# Patient Record
Sex: Female | Born: 1955 | Race: White | Hispanic: No | Smoking: Never smoker
Health system: Southern US, Community
[De-identification: ages and names within clinical notes are randomized; demographics above are authoritative.]

## PROBLEM LIST (undated history)

## (undated) DIAGNOSIS — Z8049 Family history of malignant neoplasm of other genital organs: Secondary | ICD-10-CM

## (undated) DIAGNOSIS — Z8042 Family history of malignant neoplasm of prostate: Secondary | ICD-10-CM

## (undated) DIAGNOSIS — F419 Anxiety disorder, unspecified: Secondary | ICD-10-CM

## (undated) DIAGNOSIS — Z800 Family history of malignant neoplasm of digestive organs: Secondary | ICD-10-CM

## (undated) DIAGNOSIS — R87619 Unspecified abnormal cytological findings in specimens from cervix uteri: Secondary | ICD-10-CM

## (undated) DIAGNOSIS — Z9189 Other specified personal risk factors, not elsewhere classified: Secondary | ICD-10-CM

## (undated) DIAGNOSIS — D219 Benign neoplasm of connective and other soft tissue, unspecified: Secondary | ICD-10-CM

## (undated) DIAGNOSIS — R011 Cardiac murmur, unspecified: Secondary | ICD-10-CM

## (undated) DIAGNOSIS — M810 Age-related osteoporosis without current pathological fracture: Secondary | ICD-10-CM

## (undated) DIAGNOSIS — IMO0002 Reserved for concepts with insufficient information to code with codable children: Secondary | ICD-10-CM

## (undated) DIAGNOSIS — M8580 Other specified disorders of bone density and structure, unspecified site: Secondary | ICD-10-CM

## (undated) DIAGNOSIS — F329 Major depressive disorder, single episode, unspecified: Secondary | ICD-10-CM

## (undated) DIAGNOSIS — F32A Depression, unspecified: Secondary | ICD-10-CM

## (undated) DIAGNOSIS — I493 Ventricular premature depolarization: Secondary | ICD-10-CM

## (undated) HISTORY — PX: AUGMENTATION MAMMAPLASTY: SUR837

## (undated) HISTORY — DX: Age-related osteoporosis without current pathological fracture: M81.0

## (undated) HISTORY — DX: Reserved for concepts with insufficient information to code with codable children: IMO0002

## (undated) HISTORY — DX: Unspecified abnormal cytological findings in specimens from cervix uteri: R87.619

## (undated) HISTORY — DX: Anxiety disorder, unspecified: F41.9

## (undated) HISTORY — DX: Benign neoplasm of connective and other soft tissue, unspecified: D21.9

## (undated) HISTORY — DX: Ventricular premature depolarization: I49.3

## (undated) HISTORY — DX: Family history of malignant neoplasm of prostate: Z80.42

## (undated) HISTORY — DX: Major depressive disorder, single episode, unspecified: F32.9

## (undated) HISTORY — DX: Family history of malignant neoplasm of other genital organs: Z80.49

## (undated) HISTORY — PX: KNEE SURGERY: SHX244

## (undated) HISTORY — DX: Depression, unspecified: F32.A

## (undated) HISTORY — DX: Cardiac murmur, unspecified: R01.1

## (undated) HISTORY — DX: Family history of malignant neoplasm of digestive organs: Z80.0

## (undated) HISTORY — PX: TOE SURGERY: SHX1073

## (undated) HISTORY — PX: BREAST EXCISIONAL BIOPSY: SUR124

## (undated) HISTORY — DX: Other specified personal risk factors, not elsewhere classified: Z91.89

## (undated) HISTORY — DX: Other specified disorders of bone density and structure, unspecified site: M85.80

---

## 1998-08-03 HISTORY — PX: ABDOMINAL HYSTERECTOMY: SHX81

## 1998-09-01 ENCOUNTER — Inpatient Hospital Stay (HOSPITAL_COMMUNITY): Admission: RE | Admit: 1998-09-01 | Discharge: 1998-09-03 | Payer: Self-pay | Admitting: Gynecology

## 1999-01-28 ENCOUNTER — Ambulatory Visit (HOSPITAL_COMMUNITY): Admission: RE | Admit: 1999-01-28 | Discharge: 1999-01-28 | Payer: Self-pay | Admitting: Gastroenterology

## 1999-06-17 ENCOUNTER — Encounter: Payer: Self-pay | Admitting: Gynecology

## 1999-06-17 ENCOUNTER — Encounter: Admission: RE | Admit: 1999-06-17 | Discharge: 1999-06-17 | Payer: Self-pay | Admitting: Gynecology

## 1999-11-03 ENCOUNTER — Other Ambulatory Visit: Admission: RE | Admit: 1999-11-03 | Discharge: 1999-11-03 | Payer: Self-pay | Admitting: Gynecology

## 1999-11-22 ENCOUNTER — Encounter: Payer: Self-pay | Admitting: Gynecology

## 1999-11-22 ENCOUNTER — Encounter: Admission: RE | Admit: 1999-11-22 | Discharge: 1999-11-22 | Payer: Self-pay | Admitting: Gynecology

## 2000-08-24 ENCOUNTER — Encounter: Admission: RE | Admit: 2000-08-24 | Discharge: 2000-08-24 | Payer: Self-pay | Admitting: Gynecology

## 2000-08-24 ENCOUNTER — Encounter: Payer: Self-pay | Admitting: Gynecology

## 2001-09-03 ENCOUNTER — Encounter: Payer: Self-pay | Admitting: Gynecology

## 2001-09-03 ENCOUNTER — Encounter: Admission: RE | Admit: 2001-09-03 | Discharge: 2001-09-03 | Payer: Self-pay | Admitting: Gynecology

## 2001-10-01 ENCOUNTER — Other Ambulatory Visit: Admission: RE | Admit: 2001-10-01 | Discharge: 2001-10-01 | Payer: Self-pay | Admitting: Gynecology

## 2001-11-22 ENCOUNTER — Encounter: Admission: RE | Admit: 2001-11-22 | Discharge: 2001-11-22 | Payer: Self-pay | Admitting: Gynecology

## 2001-11-22 ENCOUNTER — Encounter: Payer: Self-pay | Admitting: Gynecology

## 2002-01-19 ENCOUNTER — Ambulatory Visit (HOSPITAL_COMMUNITY): Admission: RE | Admit: 2002-01-19 | Discharge: 2002-01-19 | Payer: Self-pay | Admitting: Family Medicine

## 2002-01-19 ENCOUNTER — Encounter: Payer: Self-pay | Admitting: Family Medicine

## 2002-10-01 ENCOUNTER — Encounter: Payer: Self-pay | Admitting: Gynecology

## 2002-10-01 ENCOUNTER — Encounter: Admission: RE | Admit: 2002-10-01 | Discharge: 2002-10-01 | Payer: Self-pay | Admitting: Gynecology

## 2002-10-09 ENCOUNTER — Other Ambulatory Visit: Admission: RE | Admit: 2002-10-09 | Discharge: 2002-10-09 | Payer: Self-pay | Admitting: Gynecology

## 2003-12-17 ENCOUNTER — Encounter: Admission: RE | Admit: 2003-12-17 | Discharge: 2003-12-17 | Payer: Self-pay | Admitting: Gynecology

## 2004-11-08 ENCOUNTER — Other Ambulatory Visit: Admission: RE | Admit: 2004-11-08 | Discharge: 2004-11-08 | Payer: Self-pay | Admitting: Gynecology

## 2005-04-12 ENCOUNTER — Encounter: Admission: RE | Admit: 2005-04-12 | Discharge: 2005-04-12 | Payer: Self-pay | Admitting: Gynecology

## 2005-11-28 ENCOUNTER — Other Ambulatory Visit: Admission: RE | Admit: 2005-11-28 | Discharge: 2005-11-28 | Payer: Self-pay | Admitting: Gynecology

## 2006-07-18 ENCOUNTER — Encounter: Admission: RE | Admit: 2006-07-18 | Discharge: 2006-07-18 | Payer: Self-pay | Admitting: Gynecology

## 2006-12-25 ENCOUNTER — Other Ambulatory Visit: Admission: RE | Admit: 2006-12-25 | Discharge: 2006-12-25 | Payer: Self-pay | Admitting: Gynecology

## 2007-06-18 ENCOUNTER — Other Ambulatory Visit: Admission: RE | Admit: 2007-06-18 | Discharge: 2007-06-18 | Payer: Self-pay | Admitting: Gynecology

## 2007-11-13 ENCOUNTER — Encounter: Admission: RE | Admit: 2007-11-13 | Discharge: 2007-11-13 | Payer: Self-pay | Admitting: Gynecology

## 2009-06-28 ENCOUNTER — Encounter: Admission: RE | Admit: 2009-06-28 | Discharge: 2009-06-28 | Payer: Self-pay | Admitting: Gynecology

## 2010-07-24 ENCOUNTER — Encounter: Payer: Self-pay | Admitting: Gynecology

## 2010-08-24 ENCOUNTER — Other Ambulatory Visit: Payer: Self-pay | Admitting: Gynecology

## 2010-08-24 DIAGNOSIS — Z1231 Encounter for screening mammogram for malignant neoplasm of breast: Secondary | ICD-10-CM

## 2010-09-09 ENCOUNTER — Ambulatory Visit: Payer: Self-pay

## 2010-09-16 ENCOUNTER — Ambulatory Visit: Payer: Self-pay

## 2011-06-02 ENCOUNTER — Ambulatory Visit: Payer: Self-pay

## 2011-06-12 ENCOUNTER — Ambulatory Visit: Payer: Self-pay

## 2011-06-19 ENCOUNTER — Ambulatory Visit
Admission: RE | Admit: 2011-06-19 | Discharge: 2011-06-19 | Disposition: A | Discharge status: Home / Self Care | Payer: PRIVATE HEALTH INSURANCE | Source: Ambulatory Visit | Attending: Gynecology | Admitting: Gynecology

## 2011-06-19 DIAGNOSIS — Z1231 Encounter for screening mammogram for malignant neoplasm of breast: Secondary | ICD-10-CM

## 2012-12-11 ENCOUNTER — Encounter: Payer: Self-pay | Admitting: Obstetrics and Gynecology

## 2012-12-31 ENCOUNTER — Encounter: Payer: Self-pay | Admitting: Obstetrics and Gynecology

## 2013-01-13 ENCOUNTER — Ambulatory Visit (INDEPENDENT_AMBULATORY_CARE_PROVIDER_SITE_OTHER): Payer: PRIVATE HEALTH INSURANCE | Admitting: Obstetrics and Gynecology

## 2013-01-13 ENCOUNTER — Encounter: Payer: Self-pay | Admitting: Obstetrics and Gynecology

## 2013-01-13 VITALS — BP 100/58 | HR 80 | Ht 64.5 in | Wt 112.5 lb

## 2013-01-13 DIAGNOSIS — Z1231 Encounter for screening mammogram for malignant neoplasm of breast: Secondary | ICD-10-CM

## 2013-01-13 DIAGNOSIS — Z01419 Encounter for gynecological examination (general) (routine) without abnormal findings: Secondary | ICD-10-CM

## 2013-01-13 DIAGNOSIS — M899 Disorder of bone, unspecified: Secondary | ICD-10-CM

## 2013-01-13 DIAGNOSIS — Z0000 Encounter for general adult medical examination without abnormal findings: Secondary | ICD-10-CM

## 2013-01-13 DIAGNOSIS — M8580 Other specified disorders of bone density and structure, unspecified site: Secondary | ICD-10-CM | POA: Insufficient documentation

## 2013-01-13 LAB — POCT URINALYSIS DIPSTICK
Protein, UA: NEGATIVE
Urobilinogen, UA: NEGATIVE
pH, UA: 5

## 2013-01-13 MED ORDER — ESTRADIOL 0.1 MG/GM VA CREA: TOPICAL_CREAM | VAGINAL | Status: DC

## 2013-01-13 NOTE — Progress Notes (Signed)
Patient ID: Jenna Obrien, female   DOB: 07-15-1955, 56 y.o.   MRN: [id number] 57 y.o.   Married    Caucasian   female   G0P0   here for annual exam.   Due for a repeat pap smear. Painful intercourse.  Ran out of Estrace vaginal cream.   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  Patient cannot tell if she is getting benefit from this.  Considering stopping. Some night sweats.  Not disruptive to sleep.  No LMP recorded. Patient has had a hysterectomy.          Sexually active: yes  The current method of family planning is status post hysterectomy for uterine fibroids.  Still has ovaries. Exercising: no  Last mammogram:  06/2011 w[facility] Last pap smear: 12/2010 incomplete:?atrophic cells:advised to repeat 12/2010 but never did. History of abnormal pap: no Smoking: no Alcohol: 2 - 3 glasses of wine per night Last colonoscopy: 8-9 years ago with Dr. Medoff:[facility] Last Bone Density:  1-2 years ago through Dr. Shelbie Hutching Last tetanus shot: unsure.  Patient will check with her PCP.  Last cholesterol check: 05/2012:[facility] - Dr Clelia Croft.  Hgb:                Urine: Neg   Family History  Problem Relation Age of Onset  . Colon cancer Paternal Aunt   . Colon cancer Paternal Uncle   . Colon cancer Paternal Grandfather   . Diabetes Mother   . Hypertension Mother   . Thyroid disease Mother     There are no active problems to display for this patient.   Past Medical History  Diagnosis Date  . Fibroid   . Anxiety   . Depression   . Dyspareunia   . Heart murmur   . Osteopenia     Past Surgical History  Procedure Laterality Date  . Abdominal hysterectomy  2000    TAH--Due to fibroids--Dr. Nicholas Lose  . Augmentation mammaplasty      Allergies: Review of patient's allergies indicates no known allergies.  Current Outpatient Prescriptions  Medication Sig Dispense Refill  . estradiol (VIVELLE-DOT) 0.05 MG/24HR Place 1 patch onto the skin. Twice weekly      . rosuvastatin  (CRESTOR) 10 MG tablet Take 10 mg by mouth daily. Takes 1/2 tablet daily      . venlafaxine XR (EFFEXOR XR) 150 MG 24 hr capsule Take 150 mg by mouth. Takes 2 tablets daily      . Vitamin D, Ergocalciferol, (DRISDOL) 50000 UNITS CAPS Take 50,000 Units by mouth every 7 (seven) days.       No current facility-administered medications for this visit.    ROS: Pertinent items are noted in HPI.  Social Hx:    Exam:    BP 100/58  Pulse 80  Ht 5' 4.5" (1.638 m)  Wt 112 lb 8 oz (51.03 kg)  BMI 19.02 kg/m2   Wt Readings from Last 3 Encounters:  01/13/13 112 lb 8 oz (51.03 kg)     Ht Readings from Last 3 Encounters:  01/13/13 5' 4.5" (1.638 m)    General appearance: alert, cooperative and appears stated age Head: Normocephalic, without obvious abnormality, atraumatic Neck: no adenopathy, supple, symmetrical, trachea midline and thyroid not enlarged, symmetric, no tenderness/mass/nodules Lungs: clear to auscultation bilaterally Breasts: Inspection negative, No nipple retraction or dimpling, No nipple discharge or bleeding, No axillary or supraclavicular adenopathy, Normal to palpation without dominant masses Heart: regular rate and rhythm Abdomen: soft, non-tender;  bowel sounds normal; no masses,  no organomegaly Extremities: extremities normal, atraumatic, no cyanosis or edema Skin: Skin color, texture, turgor normal. No rashes or lesions Lymph nodes: Cervical, supraclavicular, and axillary nodes normal. No abnormal inguinal nodes palpated Neurologic: Grossly normal   Pelvic: External genitalia:  no lesions              Urethra:  normal appearing urethra with no masses, tenderness or lesions              Bartholins and Skenes: normal                 Vagina: normal appearing vagina with normal color and discharge, no lesions              Cervix:  absent              Pap taken: yes and high risk HPV testing.        Bimanual Exam:  Uterus:  uterus is normal size, shape, consistency and  nontender                                      Adnexa: normal adnexa in size, nontender and no masses                                      Rectovaginal: Confirms                                      Anus:  normal sphincter tone, no lesions  A: normal menopausal exam History of abnormal pap smear - atrophy? Osteopenia. History of DES exposure History of abnormal pap Family history of colon cancer   P: mammogram pap smear and high risk HPV Rx for Estrace vaginal cream.  See Epic orders Will call Dr. Jennye Boroughs office to schedule colonoscopy Mammogram ordered.  [facility] Patient will check date of her last bone density.   Sill continue with Vivelle Dot until re-evaluate her bone density status.  Discussed weight bearing activities. return annually or prn     An After Visit Summary was printed and given to the patient.

## 2013-01-13 NOTE — Patient Instructions (Signed)
EXERCISE AND DIET:  We recommended that you start or continue a regular exercise program for good health. Regular exercise means any activity that makes your heart beat faster and makes you sweat.  We recommend exercising at least 30 minutes per day at least 3 days a week, preferably 4 or 5.  We also recommend a diet low in fat and sugar.  Inactivity, poor dietary choices and obesity can cause diabetes, heart attack, stroke, and kidney damage, among others.    ALCOHOL AND SMOKING:  Women should limit their alcohol intake to no more than 7 drinks/beers/glasses of wine (combined, not each!) per week. Moderation of alcohol intake to this level decreases your risk of breast cancer and liver damage. And of course, no recreational drugs are part of a healthy lifestyle.  And absolutely no smoking or even second hand smoke. Most people know smoking can cause heart and lung diseases, but did you know it also contributes to weakening of your bones? Aging of your skin?  Yellowing of your teeth and nails?  CALCIUM AND VITAMIN D:  Adequate intake of calcium and Vitamin D are recommended.  The recommendations for exact amounts of these supplements seem to change often, but generally speaking 600 mg of calcium (either carbonate or citrate) and 800 units of Vitamin D per day seems prudent. Certain women may benefit from higher intake of Vitamin D.  If you are among these women, your doctor will have told you during your visit.    PAP SMEARS:  Pap smears, to check for cervical cancer or precancers,  have traditionally been done yearly, although recent scientific advances have shown that most women can have pap smears less often.  However, every woman still should have a physical exam from her gynecologist every year. It will include a breast check, inspection of the vulva and vagina to check for abnormal growths or skin changes, a visual exam of the cervix, and then an exam to evaluate the size and shape of the uterus and  ovaries.  And after 56 years of age, a rectal exam is indicated to check for rectal cancers. We will also provide age appropriate advice regarding health maintenance, like when you should have certain vaccines, screening for sexually transmitted diseases, bone density testing, colonoscopy, mammograms, etc.   MAMMOGRAMS:  All women over 40 years old should have a yearly mammogram. Many facilities now offer a "3D" mammogram, which may cost around $50 extra out of pocket. If possible,  we recommend you accept the option to have the 3D mammogram performed.  It both reduces the number of women who will be called back for extra views which then turn out to be normal, and it is better than the routine mammogram at detecting truly abnormal areas.    COLONOSCOPY:  Colonoscopy to screen for colon cancer is recommended for all women at age 50.  We know, you hate the idea of the prep.  We agree, BUT, having colon cancer and not knowing it is worse!!  Colon cancer so often starts as a polyp that can be seen and removed at colonscopy, which can quite literally save your life!  And if your first colonoscopy is normal and you have no family history of colon cancer, most women don't have to have it again for 10 years.  Once every ten years, you can do something that may end up saving your life, right?  We will be happy to help you get it scheduled when you are ready.    Be sure to check your insurance coverage so you understand how much it will cost.  It may be covered as a preventative service at no cost, but you should check your particular policy.     Osteoporosis Throughout your life, your body breaks down old bone and replaces it with new bone. As you get older, your body does not replace bone as quickly as it breaks it down. By the age of 30 years, most people begin to gradually lose bone because of the imbalance between bone loss and replacement. Some people lose more bone than others. Bone loss beyond a specified  normal degree is considered osteoporosis.  Osteoporosis affects the strength and durability of your bones. The inside of the ends of your bones and your flat bones, like the bones of your pelvis, look like honeycomb, filled with tiny open spaces. As bone loss occurs, your bones become less dense. This means that the open spaces inside your bones become bigger and the walls between these spaces become thinner. This makes your bones weaker. Bones of a person with osteoporosis can become so weak that they can break (fracture) during minor accidents, such as a simple fall. CAUSES  The following factors have been associated with the development of osteoporosis:  Smoking.  Drinking more than 2 alcoholic drinks several days per week.  Long-term use of certain medicines:  Corticosteroids.  Chemotherapy medicines.  Thyroid medicines.  Antiepileptic medicines.  Gonadal hormone suppression medicine.  Immunosuppression medicine.  Being underweight.  Lack of physical activity.  Lack of exposure to the sun. This can lead to vitamin D deficiency.  Certain medical conditions:  Certain inflammatory bowel diseases, such as Crohn's disease and ulcerative colitis.  Diabetes.  Hyperthyroidism.  Hyperparathyroidism. RISK FACTORS Anyone can develop osteoporosis. However, the following factors can increase your risk of developing osteoporosis:  Gender Women are at higher risk than men.  Age Being older than 50 years increases your risk.  Ethnicity White and Asian people have an increased risk.  Weight Being extremely underweight can increase your risk of osteoporosis.  Family history of osteoporosis Having a family member who has developed osteoporosis can increase your risk. SYMPTOMS  Usually, people with osteoporosis have no symptoms.  DIAGNOSIS  Signs during a physical exam that may prompt your caregiver to suspect osteoporosis include:  Decreased height. This is usually caused by  the compression of the bones that form your spine (vertebrae) because they have weakened and become fractured.  A curving or rounding of the upper back (kyphosis). To confirm signs of osteoporosis, your caregiver may request a procedure that uses 2 low-dose X-ray beams with different levels of energy to measure your bone mineral density (dual-energy X-ray absorptiometry [DXA]). Also, your caregiver may check your level of vitamin D. TREATMENT  The goal of osteoporosis treatment is to strengthen bones in order to decrease the risk of bone fractures. There are different types of medicines available to help achieve this goal. Some of these medicines work by slowing the processes of bone loss. Some medicines work by increasing bone density. Treatment also involves making sure that your levels of calcium and vitamin D are adequate. PREVENTION  There are things you can do to help prevent osteoporosis. Adequate intake of calcium and vitamin D can help you achieve optimal bone mineral density. Regular exercise can also help, especially resistance and weight-bearing activities. If you smoke, quitting smoking is an important part of osteoporosis prevention. MAKE SURE YOU:  Understand these instructions.  Will  watch your condition.  Will get help right away if you are not doing well or get worse. Document Released: 03/29/2005 Document Revised: 06/05/2012 Document Reviewed: 06/03/2011 [facility] Patient Information 2014 [place], [place].  Atrophic Vaginitis Atrophic vaginitis is a problem of low levels of estrogen in women. This problem can happen at any age. It is most common in women who have gone through menopause ("the change").  HOW WILL I KNOW IF I HAVE THIS PROBLEM? You may have:  Trouble with peeing (urinating), such as:  Going to the bathroom often.  A hard time holding your pee until you reach a bathroom.  Leaking pee.  Having pain when you pee.  Itching or a burning feeling.  Vaginal  bleeding and spotting.  Pain during sex.  Dryness of the vagina.  A yellow, bad-smelling fluid (discharge) coming from the vagina. HOW WILL MY DOCTOR CHECK FOR THIS PROBLEM?  During your exam, your doctor will likely find the problem.  If there is a vaginal fluid, it may be checked for infection. HOW WILL THIS PROBLEM BE TREATED? Keep the vulvar skin as clean as possible. Moisturizers and lubricants can help with some of the symptoms. Estrogen replacement can help. There are 2 ways to take estrogen:  Systemic estrogen gets estrogen to your whole body. It takes many weeks or months before the symptoms get better.  You take an estrogen pill.  You use a skin patch. This is a patch that you put on your skin.  If you still have your uterus, your doctor may ask you to take a hormone. Talk to your doctor about the right medicine for you.  Estrogen cream.  This puts estrogen only at the part of your body where you apply it. The cream is put into the vagina or put on the vulvar skin. For some women, estrogen cream works faster than pills or the patch. CAN ALL WOMEN WITH THIS PROBLEM USE ESTROGEN? No. Women with certain types of cancer, liver problems, or problems with blood clots should not take estrogen. Your doctor can help you decide the best treatment for your symptoms. Document Released: 12/06/2007 Document Revised: 09/11/2011 Document Reviewed: 12/06/2007 [facility] Patient Information 2014 [place], [place].

## 2013-01-15 LAB — IPS PAP TEST WITH HPV

## 2013-02-24 ENCOUNTER — Ambulatory Visit
Admission: RE | Admit: 2013-02-24 | Discharge: 2013-02-24 | Disposition: A | Discharge status: Home / Self Care | Payer: PRIVATE HEALTH INSURANCE | Source: Ambulatory Visit | Attending: Obstetrics and Gynecology | Admitting: Obstetrics and Gynecology

## 2013-02-24 DIAGNOSIS — Z1231 Encounter for screening mammogram for malignant neoplasm of breast: Secondary | ICD-10-CM

## 2014-03-17 ENCOUNTER — Other Ambulatory Visit (HOSPITAL_COMMUNITY): Payer: Self-pay | Admitting: Internal Medicine

## 2014-03-17 ENCOUNTER — Ambulatory Visit (HOSPITAL_COMMUNITY): Admission: RE | Admit: 2014-03-17 | Payer: PRIVATE HEALTH INSURANCE | Source: Ambulatory Visit

## 2014-03-31 ENCOUNTER — Ambulatory Visit (HOSPITAL_COMMUNITY): Admission: RE | Admit: 2014-03-31 | Payer: PRIVATE HEALTH INSURANCE | Source: Ambulatory Visit

## 2014-05-04 ENCOUNTER — Encounter: Payer: Self-pay | Admitting: Obstetrics and Gynecology

## 2014-07-06 ENCOUNTER — Other Ambulatory Visit: Payer: Self-pay

## 2014-07-06 DIAGNOSIS — Z1231 Encounter for screening mammogram for malignant neoplasm of breast: Secondary | ICD-10-CM

## 2014-07-15 ENCOUNTER — Ambulatory Visit
Admission: RE | Admit: 2014-07-15 | Discharge: 2014-07-15 | Disposition: A | Discharge status: Home / Self Care | Payer: 59 | Source: Ambulatory Visit

## 2014-07-15 DIAGNOSIS — Z1231 Encounter for screening mammogram for malignant neoplasm of breast: Secondary | ICD-10-CM

## 2014-09-01 ENCOUNTER — Ambulatory Visit (INDEPENDENT_AMBULATORY_CARE_PROVIDER_SITE_OTHER): Payer: 59 | Admitting: Nurse Practitioner

## 2014-09-01 ENCOUNTER — Encounter: Payer: Self-pay | Admitting: Nurse Practitioner

## 2014-09-01 VITALS — BP 130/74 | HR 64 | Ht 64.0 in | Wt 123.0 lb

## 2014-09-01 DIAGNOSIS — Z0000 Encounter for general adult medical examination without abnormal findings: Secondary | ICD-10-CM | POA: Diagnosis not present

## 2014-09-01 DIAGNOSIS — Z01419 Encounter for gynecological examination (general) (routine) without abnormal findings: Secondary | ICD-10-CM | POA: Diagnosis not present

## 2014-09-01 DIAGNOSIS — Z124 Encounter for screening for malignant neoplasm of cervix: Secondary | ICD-10-CM | POA: Diagnosis not present

## 2014-09-01 LAB — POCT URINALYSIS DIPSTICK
Bilirubin, UA: NEGATIVE
Glucose, UA: NEGATIVE
KETONES UA: NEGATIVE
NITRITE UA: NEGATIVE
PH UA: 5
PROTEIN UA: NEGATIVE
UROBILINOGEN UA: NEGATIVE

## 2014-09-01 NOTE — Progress Notes (Signed)
Patient ID: Jenna Obrien, female   DOB: [birth year], 59 y.o.   MRN: [id number] 59 y.o. G0P0 Married  Caucasian Fe here for annual exam.  Having no current problems.  She is not using the Estrace vaginal cream and now having increase in dyspareunia.  Patient's last menstrual period was 08/03/1998.          Sexually active: Yes.    The current method of family planning is status post hysterectomy due to fibroids.    Exercising: No.  The patient does not participate in regular exercise at present. Smoker:  no  Health Maintenance: Pap:  01/13/13, negative with neg HR HPV MMG:  07/15/14, Bi-Rads 2:  Benign  Colonoscopy:  12/2013, adenomatous polyp, repeat in 5 years BMD:   12/2013, osteoporosis with Dr. Brigitte Pulse; no treatment considering Reclast TDaP:  UTD Labs:  PCP   Urine:  Trace leuk's, trace RBC   reports that she has never smoked. She has never used smokeless tobacco. She reports that she drinks about 7.2 oz of alcohol per week. She reports that she does not use illicit drugs.  Past Medical History  Diagnosis Date  . Fibroid   . Anxiety   . Depression   . Dyspareunia   . Heart murmur   . Osteopenia   . DES exposure in utero     Past Surgical History  Procedure Laterality Date  . Augmentation mammaplasty    . Abdominal hysterectomy  08/1998    TAH--Due to fibroids--Dr. Ubaldo Glassing    Current Outpatient Prescriptions  Medication Sig Dispense Refill  . NASACORT ALLERGY 24HR 55 MCG/ACT AERO nasal inhaler Place 1 spray into both nostrils daily.  6  . rosuvastatin (CRESTOR) 10 MG tablet Take 10 mg by mouth daily. Takes 1/2 tablet daily    . venlafaxine XR (EFFEXOR XR) 150 MG 24 hr capsule Take 150 mg by mouth. Takes 2 tablets daily    . Vitamin D, Ergocalciferol, (DRISDOL) 50000 UNITS CAPS Take 50,000 Units by mouth every 7 (seven) days.    Marland Kitchen estradiol (ESTRACE) 0.1 MG/GM vaginal cream Use 1/2 g vaginally every night for the first 2 weeks, then use 1/2 g vaginally two or three times per week  as needed to maintain symptom relief. (Patient not taking: Reported on 09/01/2014) 42.5 g 3   No current facility-administered medications for this visit.    Family History  Problem Relation Age of Onset  . Colon cancer Paternal Aunt   . Colon cancer Paternal Uncle   . Colon cancer Paternal Grandfather   . Diabetes Mother   . Hypertension Mother   . Thyroid disease Mother   . AAA (abdominal aortic aneurysm) Mother   . Lung cancer Father     ROS:  Pertinent items are noted in HPI.  Otherwise, a comprehensive ROS was negative.  Exam:   BP 130/74 mmHg  Pulse 64  Ht 5\' 4"  (1.626 m)  Wt 123 lb (55.792 kg)  BMI 21.10 kg/m2  LMP 08/03/1998 Height: 5\' 4"  (162.6 cm) Ht Readings from Last 3 Encounters:  09/01/14 5\' 4"  (1.626 m)  01/13/13 5' 4.5" (1.638 m)    General appearance: alert, cooperative and appears stated age Head: Normocephalic, without obvious abnormality, atraumatic Neck: no adenopathy, supple, symmetrical, trachea midline and thyroid normal to inspection and palpation Lungs: clear to auscultation bilaterally; slight scoliosis of thoracic spine Breasts: normal appearance, no masses or tenderness Heart: regular rate and rhythm Abdomen: soft, non-tender; no masses,  no organomegaly Extremities: extremities normal,  atraumatic, no cyanosis or edema Skin: Skin color, texture, turgor normal. No rashes or lesions Lymph nodes: Cervical, supraclavicular, and axillary nodes normal. No abnormal inguinal nodes palpated Neurologic: Grossly normal   Pelvic: External genitalia:  no lesions              Urethra:  Pale atrophic appearing urethra with no masses, tenderness or lesions              Bartholin's and Skene's: normal                 Vagina: atrophic appearing vagina with pale color and discharge, no lesions              Cervix: absent              Pap taken: Yes.   Bimanual Exam:  Uterus:  uterus absent              Adnexa: no mass, fullness, tenderness                Rectovaginal: Confirms               Anus:  normal sphincter tone, no lesions  Chaperone present:  yes  A:  Well Woman with normal exam  History of abnormal pap smear - atrophy with Neg HR HPV  Osteoporosis is followed by PCP  History of DES exposure  Family history of colon cancer  Atrophic vaginitis off vaginal estrogen  R/O UTI - asymptomatic   P:   Reviewed health and wellness pertinent to exam  Pap smear taken today  Mammogram is due 1/17  Recommend Estrace vaginal cream pea size amount to the urethra.  She may use intravaginally if desired for dyspareunia.  Follow with urine culture and pap  Counseled on breast self exam, mammography screening, adequate intake of calcium and vitamin D, diet and exercise return annually or prn  An After Visit Summary was printed and given to the patient.

## 2014-09-02 NOTE — Progress Notes (Signed)
Encounter reviewed by Dr. Ziair Penson Silva.  

## 2014-09-03 LAB — URINE CULTURE
Colony Count: NO GROWTH
Organism ID, Bacteria: NO GROWTH

## 2014-09-07 LAB — IPS PAP TEST WITH HPV

## 2015-08-25 ENCOUNTER — Other Ambulatory Visit: Payer: Self-pay

## 2015-08-25 DIAGNOSIS — Z1231 Encounter for screening mammogram for malignant neoplasm of breast: Secondary | ICD-10-CM

## 2015-09-10 ENCOUNTER — Ambulatory Visit: Payer: 59 | Admitting: Obstetrics and Gynecology

## 2015-09-10 ENCOUNTER — Encounter: Payer: Self-pay | Admitting: Obstetrics and Gynecology

## 2015-09-10 ENCOUNTER — Telehealth: Payer: Self-pay | Admitting: Obstetrics and Gynecology

## 2015-09-10 NOTE — Telephone Encounter (Signed)
Patient cancelled appointment for today due to not having childcare. Patient rescheduled for 12/17/15 at 3:45 with Dr. Quincy Simmonds.

## 2015-09-10 NOTE — Telephone Encounter (Signed)
Thank you for the update!

## 2015-09-20 ENCOUNTER — Ambulatory Visit: Payer: Self-pay

## 2015-10-04 ENCOUNTER — Ambulatory Visit: Payer: Self-pay

## 2015-11-22 ENCOUNTER — Ambulatory Visit
Admission: RE | Admit: 2015-11-22 | Discharge: 2015-11-22 | Disposition: A | Discharge status: Home / Self Care | Payer: Managed Care, Other (non HMO) | Source: Ambulatory Visit

## 2015-11-22 DIAGNOSIS — Z1231 Encounter for screening mammogram for malignant neoplasm of breast: Secondary | ICD-10-CM

## 2015-12-17 ENCOUNTER — Ambulatory Visit (INDEPENDENT_AMBULATORY_CARE_PROVIDER_SITE_OTHER): Payer: Managed Care, Other (non HMO) | Admitting: Obstetrics and Gynecology

## 2015-12-17 ENCOUNTER — Encounter: Payer: Self-pay | Admitting: Obstetrics and Gynecology

## 2015-12-17 VITALS — BP 114/60 | HR 78 | Resp 14 | Ht 63.5 in | Wt 117.0 lb

## 2015-12-17 DIAGNOSIS — N952 Postmenopausal atrophic vaginitis: Secondary | ICD-10-CM | POA: Diagnosis not present

## 2015-12-17 DIAGNOSIS — M810 Age-related osteoporosis without current pathological fracture: Secondary | ICD-10-CM | POA: Diagnosis not present

## 2015-12-17 DIAGNOSIS — Z01419 Encounter for gynecological examination (general) (routine) without abnormal findings: Secondary | ICD-10-CM | POA: Diagnosis not present

## 2015-12-17 NOTE — Progress Notes (Signed)
Patient ID: Jenna Obrien, female   DOB: Aug 31, 1955, 60 y.o.   MRN: [id number] 60 y.o. G0P0 Married Caucasian female here for annual exam.    Not treating for osteoporosis.  Due for bone density this year.  Some vaginal dryness.  PCP:   Dr. Marton Redwood.    Patient's last menstrual period was 08/03/1998.           Sexually active: Yes.    The current method of family planning is status post hysterectomy.    Exercising: No.  The patient does not participate in regular exercise at present. Smoker:  no  Health Maintenance: Pap:  09-01-14 Neg:Neg HR HPV History of abnormal Pap:  yes MMG:  11-23-15 Saline Implants/Density B/Neg/BiRads1:[facility] Colonoscopy:  June 2015 - polyps. Dr. Earlean Shawl.   Due in 2020.  BMD:   *01/07/14 Result  Osteoporosis.  Took Foxamax and had reflux symptoms.   Dr. Brigitte Pulse monitoring.  TDaP:  PCP Gardasil:   no HIV: PCP Hep C: PCP Screening Labs: PCP Hb today: PCP, Urine today: No   reports that she has never smoked. She has never used smokeless tobacco. She reports that she drinks about 7.2 oz of alcohol per week. She reports that she does not use illicit drugs.  Past Medical History  Diagnosis Date  . Fibroid   . Anxiety   . Depression   . Dyspareunia   . Heart murmur   . Osteopenia   . DES exposure in utero     Past Surgical History  Procedure Laterality Date  . Augmentation mammaplasty    . Abdominal hysterectomy  08/1998    TAH--Due to fibroids--Dr. Ubaldo Glassing    Current Outpatient Prescriptions  Medication Sig Dispense Refill  . calcipotriene-betamethasone (TACLONEX) ointment     . NASACORT ALLERGY 24HR 55 MCG/ACT AERO nasal inhaler Place 1 spray into both nostrils daily.  6  . rosuvastatin (CRESTOR) 10 MG tablet Take 10 mg by mouth daily. Takes 1/2 tablet daily    . venlafaxine XR (EFFEXOR XR) 150 MG 24 hr capsule Take 150 mg by mouth. Takes 2 tablets daily     No current facility-administered medications for this visit.    Family History   Problem Relation Age of Onset  . Colon cancer Paternal Aunt   . Colon cancer Paternal Uncle   . Colon cancer Paternal Grandfather   . Diabetes Mother   . Hypertension Mother   . Thyroid disease Mother   . AAA (abdominal aortic aneurysm) Mother   . Lung cancer Father     ROS:  Pertinent items are noted in HPI.  Otherwise, a comprehensive ROS was negative.  Exam:   BP 114/60 mmHg  Pulse 78  Resp 14  Ht 5' 3.5" (1.613 m)  Wt 117 lb (53.071 kg)  BMI 20.40 kg/m2  LMP 08/03/1998    General appearance: alert, cooperative and appears stated age Head: Normocephalic, without obvious abnormality, atraumatic Neck: no adenopathy, supple, symmetrical, trachea midline and thyroid normal to inspection and palpation Lungs: clear to auscultation bilaterally Breasts: normal appearance, no masses or tenderness, No nipple retraction or dimpling, No nipple discharge or bleeding, No axillary or supraclavicular adenopathy, bilateral implants. Heart: regular rate and rhythm Abdomen:  soft, non-tender; no masses, no organomegaly Extremities: extremities normal, atraumatic, no cyanosis or edema Skin: Skin color, texture, turgor normal. No rashes or lesions Lymph nodes: Cervical, supraclavicular, and axillary nodes normal. No abnormal inguinal nodes palpated Neurologic: Grossly normal  Pelvic: External genitalia:  no  lesions              Urethra:  normal appearing urethra with no masses, tenderness or lesions              Bartholins and Skenes: normal                 Vagina:  Atrophy noted.  No lesions.               Cervix: absent.              Pap taken: Yes.   Bimanual Exam:  Uterus:  uterus absent              Adnexa: no mass, fullness, tenderness              Rectal exam: Yes.  .  Confirms.              Anus:  normal sphincter tone, no lesions  Chaperone was present for exam.  Assessment:   Well woman visit with normal exam. Status post TAH for fibroids.  Ovaries remain.  Vaginal  atrophy.  Osteoporosis.  History of DES exposure History of abnormal pap Family history of colon cancer  Plan: Yearly mammogram recommended after age 69.  Recommended self breast exam.  Pap and HR HPV as above. Discussed Calcium, Vitamin D, regular exercise program including cardiovascular and weight bearing exercise. Labs performed.  No..     Prescription medication(s) given.  No..   Discussed cooking oils and vit E suppositories for vaginal dryness. Bone density through PCP.  Discussed possible Prolia tx.  Written information given.  Follow up annually and prn.       After visit summary provided.

## 2015-12-17 NOTE — Patient Instructions (Signed)
Health Maintenance, Female Adopting a healthy lifestyle and getting preventive care can go a long way to promote health and wellness. Talk with your health care provider about what schedule of regular examinations is right for you. This is a good chance for you to check in with your provider about disease prevention and staying healthy. In between checkups, there are plenty of things you can do on your own. Experts have done a lot of research about which lifestyle changes and preventive measures are most likely to keep you healthy. Ask your health care provider for more information. WEIGHT AND DIET  Eat a healthy diet  Be sure to include plenty of vegetables, fruits, low-fat dairy products, and lean protein.  Do not eat a lot of foods high in solid fats, added sugars, or salt.  Get regular exercise. This is one of the most important things you can do for your health.  Most adults should exercise for at least 150 minutes each week. The exercise should increase your heart rate and make you sweat (moderate-intensity exercise).  Most adults should also do strengthening exercises at least twice a week. This is in addition to the moderate-intensity exercise.  Maintain a healthy weight  Body mass index (BMI) is a measurement that can be used to identify possible weight problems. It estimates body fat based on height and weight. Your health care provider can help determine your BMI and help you achieve or maintain a healthy weight.  For females 38 years of age and older:   A BMI below 18.5 is considered underweight.  A BMI of 18.5 to 24.9 is normal.  A BMI of 25 to 29.9 is considered overweight.  A BMI of 30 and above is considered obese.  Watch levels of cholesterol and blood lipids  You should start having your blood tested for lipids and cholesterol at 59 years of age, then have this test every 5 years.  You may need to have your cholesterol levels checked more often if:  Your lipid  or cholesterol levels are high.  You are older than 59 years of age.  You are at high risk for heart disease.  CANCER SCREENING   Lung Cancer  Lung cancer screening is recommended for adults 80-48 years old who are at high risk for lung cancer because of a history of smoking.  A yearly low-dose CT scan of the lungs is recommended for people who:  Currently smoke.  Have quit within the past 15 years.  Have at least a 30-pack-year history of smoking. A pack year is smoking an average of one pack of cigarettes a day for 1 year.  Yearly screening should continue until it has been 15 years since you quit.  Yearly screening should stop if you develop a health problem that would prevent you from having lung cancer treatment.  Breast Cancer  Practice breast self-awareness. This means understanding how your breasts normally appear and feel.  It also means doing regular breast self-exams. Let your health care provider know about any changes, no matter how small.  If you are in your 20s or 30s, you should have a clinical breast exam (CBE) by a health care provider every 1-3 years as part of a regular health exam.  If you are 8 or older, have a CBE every year. Also consider having a breast X-ray (mammogram) every year.  If you have a family history of breast cancer, talk to your health care provider about genetic screening.  If you  are at high risk for breast cancer, talk to your health care provider about having an MRI and a mammogram every year.  Breast cancer gene (BRCA) assessment is recommended for women who have family members with BRCA-related cancers. BRCA-related cancers include:  Breast.  Ovarian.  Tubal.  Peritoneal cancers.  Results of the assessment will determine the need for genetic counseling and BRCA1 and BRCA2 testing. Cervical Cancer Your health care provider may recommend that you be screened regularly for cancer of the pelvic organs (ovaries, uterus, and  vagina). This screening involves a pelvic examination, including checking for microscopic changes to the surface of your cervix (Pap test). You may be encouraged to have this screening done every 3 years, beginning at age 21.  For women ages 30-65, health care providers may recommend pelvic exams and Pap testing every 3 years, or they may recommend the Pap and pelvic exam, combined with testing for human papilloma virus (HPV), every 5 years. Some types of HPV increase your risk of cervical cancer. Testing for HPV may also be done on women of any age with unclear Pap test results.  Other health care providers may not recommend any screening for nonpregnant women who are considered low risk for pelvic cancer and who do not have symptoms. Ask your health care provider if a screening pelvic exam is right for you.  If you have had past treatment for cervical cancer or a condition that could lead to cancer, you need Pap tests and screening for cancer for at least 20 years after your treatment. If Pap tests have been discontinued, your risk factors (such as having a new sexual partner) need to be reassessed to determine if screening should resume. Some women have medical problems that increase the chance of getting cervical cancer. In these cases, your health care provider may recommend more frequent screening and Pap tests. Colorectal Cancer  This type of cancer can be detected and often prevented.  Routine colorectal cancer screening usually begins at 59 years of age and continues through 59 years of age.  Your health care provider may recommend screening at an earlier age if you have risk factors for colon cancer.  Your health care provider may also recommend using home test kits to check for hidden blood in the stool.  A small camera at the end of a tube can be used to examine your colon directly (sigmoidoscopy or colonoscopy). This is done to check for the earliest forms of colorectal  cancer.  Routine screening usually begins at age 50.  Direct examination of the colon should be repeated every 5-10 years through 59 years of age. However, you may need to be screened more often if early forms of precancerous polyps or small growths are found. Skin Cancer  Check your skin from head to toe regularly.  Tell your health care provider about any new moles or changes in moles, especially if there is a change in a mole's shape or color.  Also tell your health care provider if you have a mole that is larger than the size of a pencil eraser.  Always use sunscreen. Apply sunscreen liberally and repeatedly throughout the day.  Protect yourself by wearing long sleeves, pants, a wide-brimmed hat, and sunglasses whenever you are outside. HEART DISEASE, DIABETES, AND HIGH BLOOD PRESSURE   High blood pressure causes heart disease and increases the risk of stroke. High blood pressure is more likely to develop in:  People who have blood pressure in the high end   of the normal range (130-139/85-89 mm Hg).  People who are overweight or obese.  People who are African American.  If you are 38-23 years of age, have your blood pressure checked every 3-5 years. If you are 61 years of age or older, have your blood pressure checked every year. You should have your blood pressure measured twice--once when you are at a hospital or clinic, and once when you are not at a hospital or clinic. Record the average of the two measurements. To check your blood pressure when you are not at a hospital or clinic, you can use:  An automated blood pressure machine at a pharmacy.  A home blood pressure monitor.  If you are between 45 years and 39 years old, ask your health care provider if you should take aspirin to prevent strokes.  Have regular diabetes screenings. This involves taking a blood sample to check your fasting blood sugar level.  If you are at a normal weight and have a low risk for diabetes,  have this test once every three years after 59 years of age.  If you are overweight and have a high risk for diabetes, consider being tested at a younger age or more often. PREVENTING INFECTION  Hepatitis B  If you have a higher risk for hepatitis B, you should be screened for this virus. You are considered at high risk for hepatitis B if:  You were born in a country where hepatitis B is common. Ask your health care provider which countries are considered high risk.  Your parents were born in a high-risk country, and you have not been immunized against hepatitis B (hepatitis B vaccine).  You have HIV or AIDS.  You use needles to inject street drugs.  You live with someone who has hepatitis B.  You have had sex with someone who has hepatitis B.  You get hemodialysis treatment.  You take certain medicines for conditions, including cancer, organ transplantation, and autoimmune conditions. Hepatitis C  Blood testing is recommended for:  Everyone born from [birth year] through 1964.  Anyone with known risk factors for hepatitis C. Sexually transmitted infections (STIs)  You should be screened for sexually transmitted infections (STIs) including gonorrhea and chlamydia if:  You are sexually active and are younger than 59 years of age.  You are older than 59 years of age and your health care provider tells you that you are at risk for this type of infection.  Your sexual activity has changed since you were last screened and you are at an increased risk for chlamydia or gonorrhea. Ask your health care provider if you are at risk.  If you do not have HIV, but are at risk, it may be recommended that you take a prescription medicine daily to prevent HIV infection. This is called pre-exposure prophylaxis (PrEP). You are considered at risk if:  You are sexually active and do not regularly use condoms or know the HIV status of your partner(s).  You take drugs by injection.  You are sexually  active with a partner who has HIV. Talk with your health care provider about whether you are at high risk of being infected with HIV. If you choose to begin PrEP, you should first be tested for HIV. You should then be tested every 3 months for as long as you are taking PrEP.  PREGNANCY   If you are premenopausal and you may become pregnant, ask your health care provider about preconception counseling.  If you may  become pregnant, take 400 to 800 micrograms (mcg) of folic acid every day.  If you want to prevent pregnancy, talk to your health care provider about birth control (contraception). OSTEOPOROSIS AND MENOPAUSE   Osteoporosis is a disease in which the bones lose minerals and strength with aging. This can result in serious bone fractures. Your risk for osteoporosis can be identified using a bone density scan.  If you are 65 years of age or older, or if you are at risk for osteoporosis and fractures, ask your health care provider if you should be screened.  Ask your health care provider whether you should take a calcium or vitamin D supplement to lower your risk for osteoporosis.  Menopause may have certain physical symptoms and risks.  Hormone replacement therapy may reduce some of these symptoms and risks. Talk to your health care provider about whether hormone replacement therapy is right for you.  HOME CARE INSTRUCTIONS   Schedule regular health, dental, and eye exams.  Stay current with your immunizations.   Do not use any tobacco products including cigarettes, chewing tobacco, or electronic cigarettes.  If you are pregnant, do not drink alcohol.  If you are breastfeeding, limit how much and how often you drink alcohol.  Limit alcohol intake to no more than 1 drink per day for nonpregnant women. One drink equals 12 ounces of beer, 5 ounces of wine, or 1 ounces of hard liquor.  Do not use street drugs.  Do not share needles.  Ask your health care provider for help if  you need support or information about quitting drugs.  Tell your health care provider if you often feel depressed.  Tell your health care provider if you have ever been abused or do not feel safe at home.   This information is not intended to replace advice given to you by your health care provider. Make sure you discuss any questions you have with your health care provider.   Document Released: 01/02/2011 Document Revised: 07/10/2014 Document Reviewed: 05/21/2013 Elsevier Interactive Patient Education 2016 Elsevier Inc.   Denosumab injection What is this medicine? DENOSUMAB (den oh sue mab) slows bone breakdown. Prolia is used to treat osteoporosis in women after menopause and in men. Xgeva is used to prevent bone fractures and other bone problems caused by cancer bone metastases. Xgeva is also used to treat giant cell tumor of the bone. This medicine may be used for other purposes; ask your health care provider or pharmacist if you have questions. What should I tell my health care provider before I take this medicine? They need to know if you have any of these conditions: -dental disease -eczema -infection or history of infections -kidney disease or on dialysis -low blood calcium or vitamin D -malabsorption syndrome -scheduled to have surgery or tooth extraction -taking medicine that contains denosumab -thyroid or parathyroid disease -an unusual reaction to denosumab, other medicines, foods, dyes, or preservatives -pregnant or trying to get pregnant -breast-feeding How should I use this medicine? This medicine is for injection under the skin. It is given by a health care professional in a hospital or clinic setting. If you are getting Prolia, a special MedGuide will be given to you by the pharmacist with each prescription and refill. Be sure to read this information carefully each time. For Prolia, talk to your pediatrician regarding the use of this medicine in children. Special  care may be needed. For Xgeva, talk to your pediatrician regarding the use of this medicine in children.   While this drug may be prescribed for children as young as 13 years for selected conditions, precautions do apply. Overdosage: If you think you have taken too much of this medicine contact a poison control center or emergency room at once. NOTE: This medicine is only for you. Do not share this medicine with others. What if I miss a dose? It is important not to miss your dose. Call your doctor or health care professional if you are unable to keep an appointment. What may interact with this medicine? Do not take this medicine with any of the following medications: -other medicines containing denosumab This medicine may also interact with the following medications: -medicines that suppress the immune system -medicines that treat cancer -steroid medicines like prednisone or cortisone This list may not describe all possible interactions. Give your health care provider a list of all the medicines, herbs, non-prescription drugs, or dietary supplements you use. Also tell them if you smoke, drink alcohol, or use illegal drugs. Some items may interact with your medicine. What should I watch for while using this medicine? Visit your doctor or health care professional for regular checks on your progress. Your doctor or health care professional may order blood tests and other tests to see how you are doing. Call your doctor or health care professional if you get a cold or other infection while receiving this medicine. Do not treat yourself. This medicine may decrease your body's ability to fight infection. You should make sure you get enough calcium and vitamin D while you are taking this medicine, unless your doctor tells you not to. Discuss the foods you eat and the vitamins you take with your health care professional. See your dentist regularly. Brush and floss your teeth as directed. Before you have any  dental work done, tell your dentist you are receiving this medicine. Do not become pregnant while taking this medicine or for 5 months after stopping it. Women should inform their doctor if they wish to become pregnant or think they might be pregnant. There is a potential for serious side effects to an unborn child. Talk to your health care professional or pharmacist for more information. What side effects may I notice from receiving this medicine? Side effects that you should report to your doctor or health care professional as soon as possible: -allergic reactions like skin rash, itching or hives, swelling of the face, lips, or tongue -breathing problems -chest pain -fast, irregular heartbeat -feeling faint or lightheaded, falls -fever, chills, or any other sign of infection -muscle spasms, tightening, or twitches -numbness or tingling -skin blisters or bumps, or is dry, peels, or red -slow healing or unexplained pain in the mouth or jaw -unusual bleeding or bruising Side effects that usually do not require medical attention (Report these to your doctor or health care professional if they continue or are bothersome.): -muscle pain -stomach upset, gas This list may not describe all possible side effects. Call your doctor for medical advice about side effects. You may report side effects to FDA at 1-800-FDA-1088. Where should I keep my medicine? This medicine is only given in a clinic, doctor's office, or other health care setting and will not be stored at home. NOTE: This sheet is a summary. It may not cover all possible information. If you have questions about this medicine, talk to your doctor, pharmacist, or health care provider.    2016, Elsevier/Gold Standard. (2011-12-18 12:37:47)

## 2015-12-22 LAB — IPS PAP TEST WITH HPV

## 2016-12-18 ENCOUNTER — Ambulatory Visit: Payer: Managed Care, Other (non HMO) | Admitting: Obstetrics and Gynecology

## 2016-12-18 NOTE — Progress Notes (Signed)
61 y.o. G0P0 Married Caucasian female here for annual exam.    Turning 61 today!  Really concerned about colon cancer.  Changed her diet - no grain, low carb. Change in the texture of her stool.  Starting Prolia.   Saw her PCP for palpitations.  EKG was normal.   Some short term memory issues.   PCP:   Dr. Brigitte Pulse.  Patient's last menstrual period was 08/03/1998.           Sexually active: No.  The current method of family planning is status post hysterectomy.    Exercising: No.  exercise Smoker:  no  Health Maintenance: Pap:  09-01-14 neg HPV HR neg, 12-17-15 neg HPV HR neg History of abnormal Pap:  yes MMG:  11-22-15 category b density birads 1:neg Colonoscopy:  2015 f/u 5 yrs BMD:   2015  Result  Osteoporosis. Took fosamax & had reflux symptoms. Dr Brigitte Pulse monitoring TDaP:  PCP Gardasil:   N/A HIV: not done Hep C: not done Screening Labs:     reports that she has never smoked. She has never used smokeless tobacco. She reports that she drinks alcohol. She reports that she does not use drugs.  Past Medical History:  Diagnosis Date  . Abnormal Pap smear of cervix    in 20's  . Anxiety   . Depression   . DES exposure in utero   . Dyspareunia   . Fibroid   . Heart murmur   . Osteopenia     Past Surgical History:  Procedure Laterality Date  . ABDOMINAL HYSTERECTOMY  08/1998   TAH--Due to fibroids--Dr. Ubaldo Glassing  . AUGMENTATION MAMMAPLASTY      Current Outpatient Prescriptions  Medication Sig Dispense Refill  . calcipotriene-betamethasone (TACLONEX) ointment     . NASACORT ALLERGY 24HR 55 MCG/ACT AERO nasal inhaler Place 1 spray into both nostrils daily.  6  . pantoprazole (PROTONIX) 40 MG tablet     . PROLIA 60 MG/ML SOLN injection     . venlafaxine XR (EFFEXOR XR) 150 MG 24 hr capsule Take 150 mg by mouth. Takes 2 tablets daily    . Vitamin D, Ergocalciferol, (DRISDOL) 50000 units CAPS capsule Take 50,000 Units by mouth every 7 (seven) days.     No current  facility-administered medications for this visit.     Family History  Problem Relation Age of Onset  . Diabetes Mother   . Hypertension Mother   . Thyroid disease Mother   . AAA (abdominal aortic aneurysm) Mother   . Lung cancer Father   . Colon cancer Paternal Aunt   . Colon cancer Paternal Uncle   . Colon cancer Paternal Grandfather    Paternal cousin with uterine and lung cancer. Father and paternal uncle died from lung cancer.  ROS:  Pertinent items are noted in HPI.  Otherwise, a comprehensive ROS was negative.  Exam:   BP 116/64   Pulse 70   Resp 16   Ht 5' 3.75" (1.619 m)   Wt 113 lb (51.3 kg)   LMP 08/03/1998   BMI 19.55 kg/m     General appearance: alert, cooperative and appears stated age Head: Normocephalic, without obvious abnormality, atraumatic Neck: no adenopathy, supple, symmetrical, trachea midline and thyroid normal to inspection and palpation Lungs: clear to auscultation bilaterally Breasts: bilateral implants, normal appearance, no masses or tenderness, No nipple retraction or dimpling, No nipple discharge or bleeding, No axillary or supraclavicular adenopathy Heart: regular rate and rhythm Abdomen: soft, non-tender; no masses, no  organomegaly Extremities: extremities normal, atraumatic, no cyanosis or edema Skin: Skin color, texture, turgor normal. No rashes or lesions Lymph nodes: Cervical, supraclavicular, and axillary nodes normal. No abnormal inguinal nodes palpated Neurologic: Grossly normal  Pelvic: External genitalia:  no lesions              Urethra:  normal appearing urethra with no masses, tenderness or lesions              Bartholins and Skenes: normal                 Vagina: normal appearing vagina with normal color and discharge, no lesions, atrophy noted.               Cervix: absent.                Pap taken: Yes.   Bimanual Exam:  Uterus:  normal size, contour, position, consistency, mobility, non-tender              Adnexa: no  mass, fullness, tenderness              Rectal exam: Yes.  .  Confirms.              Anus:  normal sphincter tone, no lesions  Chaperone was present for exam.  Assessment:   Well woman visit with normal exam. Status post TAH for fibroids.  Ovaries remain.  Status post bilateral breast implants.  Vaginal atrophy.  Osteoporosis.  Starting Prolia.  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  Hx abnormal pap.  Remote hx cryotherapy.  FH colon, lung, and uterine cancer.  Plan: Mammogram screening discussed.  She will schedule.  Recommended self breast awareness. Pap and HR HPV as above. Guidelines for Calcium, Vitamin D, regular exercise program including cardiovascular and weight bearing exercise. IFOB.  Referral for genetic counseling and testing.  Follow up annually and prn.   After visit summary provided.

## 2016-12-20 ENCOUNTER — Ambulatory Visit (INDEPENDENT_AMBULATORY_CARE_PROVIDER_SITE_OTHER): Payer: Managed Care, Other (non HMO) | Admitting: Obstetrics and Gynecology

## 2016-12-20 ENCOUNTER — Encounter: Payer: Self-pay | Admitting: Obstetrics and Gynecology

## 2016-12-20 ENCOUNTER — Other Ambulatory Visit (HOSPITAL_COMMUNITY)
Admission: RE | Admit: 2016-12-20 | Discharge: 2016-12-20 | Disposition: A | Discharge status: Home / Self Care | Payer: Managed Care, Other (non HMO) | Source: Ambulatory Visit | Attending: Obstetrics and Gynecology | Admitting: Obstetrics and Gynecology

## 2016-12-20 VITALS — BP 116/64 | HR 70 | Resp 16 | Ht 63.75 in | Wt 113.0 lb

## 2016-12-20 DIAGNOSIS — Z1211 Encounter for screening for malignant neoplasm of colon: Secondary | ICD-10-CM | POA: Diagnosis not present

## 2016-12-20 DIAGNOSIS — Z801 Family history of malignant neoplasm of trachea, bronchus and lung: Secondary | ICD-10-CM

## 2016-12-20 DIAGNOSIS — Z01419 Encounter for gynecological examination (general) (routine) without abnormal findings: Secondary | ICD-10-CM

## 2016-12-20 DIAGNOSIS — Z8049 Family history of malignant neoplasm of other genital organs: Secondary | ICD-10-CM

## 2016-12-20 DIAGNOSIS — Z800 Family history of malignant neoplasm of digestive organs: Secondary | ICD-10-CM | POA: Diagnosis not present

## 2016-12-20 NOTE — Patient Instructions (Signed)

## 2016-12-25 LAB — CYTOLOGY - PAP
Diagnosis: NEGATIVE
HPV (WINDOPATH): NOT DETECTED

## 2016-12-26 ENCOUNTER — Telehealth: Payer: Self-pay | Admitting: Obstetrics and Gynecology

## 2016-12-26 NOTE — Telephone Encounter (Signed)
Please contact patient in follow up to her IFOB taken home with her last week.  She came up in my reminder box to complete this.  Please have her send it in if she has not already done so.  Thank you.

## 2017-01-04 LAB — FECAL OCCULT BLOOD, IMMUNOCHEMICAL: IMMUNOLOGICAL FECAL OCCULT BLOOD TEST: NEGATIVE

## 2017-01-12 ENCOUNTER — Telehealth: Payer: Self-pay | Admitting: Obstetrics and Gynecology

## 2017-01-12 NOTE — Telephone Encounter (Signed)
Call to patient to provide information for scheduling a referral to Genetic counseling. Patient may call [phone] to schedule. She may note there is an existing referral in the system. Genetics notes they left a voicemail for the patient on 01-01-17.

## 2017-01-17 ENCOUNTER — Other Ambulatory Visit: Payer: Self-pay | Admitting: Internal Medicine

## 2017-01-17 DIAGNOSIS — Z1231 Encounter for screening mammogram for malignant neoplasm of breast: Secondary | ICD-10-CM

## 2017-01-19 ENCOUNTER — Encounter: Payer: Self-pay | Admitting: Genetic Counselor

## 2017-01-19 ENCOUNTER — Telehealth: Payer: Self-pay | Admitting: Genetic Counselor

## 2017-01-19 NOTE — Telephone Encounter (Signed)
Genetic counseling appt has been scheduled for the pt to see Santiago Glad on 8/8 at 2pm. Address and insurance verified. Letter mailed.

## 2017-01-26 ENCOUNTER — Ambulatory Visit: Payer: Managed Care, Other (non HMO)

## 2017-01-31 ENCOUNTER — Ambulatory Visit
Admission: RE | Admit: 2017-01-31 | Discharge: 2017-01-31 | Disposition: A | Discharge status: Home / Self Care | Payer: Managed Care, Other (non HMO) | Source: Ambulatory Visit | Attending: Internal Medicine | Admitting: Internal Medicine

## 2017-01-31 DIAGNOSIS — Z1231 Encounter for screening mammogram for malignant neoplasm of breast: Secondary | ICD-10-CM

## 2017-02-07 ENCOUNTER — Other Ambulatory Visit: Payer: Managed Care, Other (non HMO)

## 2017-02-07 ENCOUNTER — Ambulatory Visit (HOSPITAL_BASED_OUTPATIENT_CLINIC_OR_DEPARTMENT_OTHER): Payer: Managed Care, Other (non HMO) | Admitting: Genetic Counselor

## 2017-02-07 DIAGNOSIS — Z800 Family history of malignant neoplasm of digestive organs: Secondary | ICD-10-CM | POA: Diagnosis not present

## 2017-02-07 DIAGNOSIS — Z8042 Family history of malignant neoplasm of prostate: Secondary | ICD-10-CM

## 2017-02-07 DIAGNOSIS — Z8049 Family history of malignant neoplasm of other genital organs: Secondary | ICD-10-CM | POA: Diagnosis not present

## 2017-02-13 ENCOUNTER — Encounter: Payer: Self-pay | Admitting: Genetic Counselor

## 2017-02-13 DIAGNOSIS — Z8042 Family history of malignant neoplasm of prostate: Secondary | ICD-10-CM | POA: Insufficient documentation

## 2017-02-13 DIAGNOSIS — Z800 Family history of malignant neoplasm of digestive organs: Secondary | ICD-10-CM | POA: Insufficient documentation

## 2017-02-13 DIAGNOSIS — Z8049 Family history of malignant neoplasm of other genital organs: Secondary | ICD-10-CM | POA: Insufficient documentation

## 2017-02-13 NOTE — Progress Notes (Signed)
REFERRING PROVIDER: Marton Redwood, MD [address] [place], [place] [address]  PRIMARY PROVIDER:  Marton Redwood, MD  PRIMARY REASON FOR VISIT:  1. Family history of colon cancer   2. Family history of uterine cancer   3. Family history of prostate cancer      HISTORY OF PRESENT ILLNESS:   Jenna Obrien, a 61 y.o. female, was seen for a [facility] cancer genetics consultation at the request of Dr. Brigitte Obrien due to a family history of cancer.  Jenna Obrien presents to clinic today to discuss the possibility of a hereditary predisposition to cancer, genetic testing, and to further clarify her future cancer risks, as well as potential cancer risks for family members. Jenna Obrien is a 61 y.o. female with no personal history of cancer.  She has had a colonoscopy and only one polyp has been found.  CANCER HISTORY:   No history exists.     HORMONAL RISK FACTORS:  Menarche was at age 59.  First live birth at age 7 - adopted children.  OCP use for approximately 5+ years.  Ovaries intact: no.  Hysterectomy: yes.  Menopausal status: postmenopausal.  HRT use: 0 years. Colonoscopy: yes; 1 polyp. Mammogram within the last year: yes. Number of breast biopsies: 2. Up to date with pelvic exams:  yes. Any excessive radiation exposure in the past:  no  Past Medical History:  Diagnosis Date  . Abnormal Pap smear of cervix    in 20's  . Anxiety   . Depression   . DES exposure in utero   . Dyspareunia   . Family history of colon cancer   . Family history of prostate cancer   . Family history of uterine cancer   . Fibroid   . Heart murmur   . Osteopenia     Past Surgical History:  Procedure Laterality Date  . ABDOMINAL HYSTERECTOMY  08/1998   TAH--Due to fibroids--Dr. Ubaldo Obrien  . AUGMENTATION MAMMAPLASTY    . BREAST EXCISIONAL BIOPSY      Social History   Social History  . Marital status: Married    Spouse name: N/A  . Number of children: N/A  . Years of education: N/A   Social  History Main Topics  . Smoking status: Never Smoker  . Smokeless tobacco: Never Used  . Alcohol use Yes     Comment: 2-3 glasses of wine per night  . Drug use: No  . Sexual activity: No     Comment: TAH   Other Topics Concern  . None   Social History Narrative  . None     FAMILY HISTORY:  We obtained a detailed, 4-generation family history.  Significant diagnoses are listed below: Family History  Problem Relation Age of Onset  . Diabetes Mother   . Hypertension Mother   . Thyroid disease Mother   . AAA (abdominal aortic aneurysm) Mother   . Lung cancer Father 37  . Prostate cancer Father        dx in his 4s  . Colon cancer Paternal Aunt        dx in her 80s  . Colon cancer Paternal Uncle        colon cancer vs colon polyps  . Colon cancer Paternal Grandfather        dx over 66s  . Head & neck cancer Maternal Uncle        oral cancer  . Heart attack Maternal Grandmother   . Heart attack Maternal Grandfather   .  Lung cancer Paternal Uncle        dx in his 67s-50s  . Colon cancer Cousin        paternal cousin dx in her 16s-40  . Uterine cancer Cousin        paternal cousin    The patient has two adopted daughters.  She is an only child.  Her mother died at age 46 due to an AAA.  Her mother took DES during the pregnancy with the patient.  Her mother had a paternal half sister and a maternal half brother.  The sister is alive with no children.  The brother died of an oral cancer.  Both maternal grandparents died of a heart attack.  The patient's father developed prostate cancer in his 54's and lung cancer at 16.  He had two brothers and one sister.  His sister had colon cancer in her 48's, and had a daughter with uterine cancer.  One brother died of lung cancer and he had a daughter die of colon cancer in her 30's-40's.  The remaining brother had colon polyps and possibly colon cancer.  The paternal grandfather died of colon cancer and the grandmother died in a car  accident.  Jenna Obrien is unaware of previous family history of genetic testing for hereditary cancer risks. Patient's maternal ancestors are of [place] descent, and paternal ancestors are of [place] descent. There is no reported Ashkenazi Jewish ancestry. There is no known consanguinity.  GENETIC COUNSELING ASSESSMENT: Jenna Obrien is a 61 y.o. female with a family history of colon, prostate and uterine cancer which is somewhat suggestive of a hereditary cancer syndrome such as Lynch syndrome and predisposition to cancer. We, therefore, discussed and recommended the following at today's visit.   DISCUSSION: We discussed that about 5-6% of colon cancer is hereditary with most cases due to Lynch syndrome.  The patient is also hat an increased risk for vaginal and other GYN cancers due to her prenatal exposure to DES.  We reviewed the characteristics, features and inheritance patterns of hereditary cancer syndromes. We also discussed genetic testing, including the appropriate family members to test, the process of testing, insurance coverage and turn-around-time for results. We discussed the implications of a negative, positive and/or variant of uncertain significant result. We recommended Jenna Obrien pursue genetic testing for the common hereditary cancer gene panel. The Hereditary Gene Panel offered by [facility] includes sequencing and/or deletion duplication testing of the following 46 genes: APC, ATM, AXIN2, BARD1, BMPR1A, BRCA1, BRCA2, BRIP1, CDH1, CDKN2A (p14ARF), CDKN2A (p16INK4a), CHEK2, CTNNA1, DICER1, EPCAM (Deletion/duplication testing only), GREM1 (promoter region deletion/duplication testing only), KIT, MEN1, MLH1, MSH2, MSH3, MSH6, MUTYH, NBN, NF1, NHTL1, PALB2, PDGFRA, PMS2, POLD1, POLE, PTEN, RAD50, RAD51C, RAD51D, SDHB, SDHC, SDHD, SMAD4, SMARCA4. STK11, TP53, TSC1, TSC2, and VHL.  The following genes were evaluated for sequence changes only: SDHA and HOXB13 c.251G>A variant only.  Based  on Jenna Obrien family history of cancer, she meets medical criteria for genetic testing. Despite that she meets criteria, she may still have an out of pocket cost. We discussed that if her out of pocket cost for testing is over $100, the laboratory will call and confirm whether she wants to proceed with testing.  If the out of pocket cost of testing is less than $100 she will be billed by the genetic testing laboratory.   PLAN: After considering the risks, benefits, and limitations, Jenna Obrien  provided informed consent to pursue genetic testing and the blood sample was  sent to [facility] for analysis of the Common Hereditary Cancer Panel. Results should be available within approximately 2-3 weeks' time, at which point they will be disclosed by telephone to Jenna Obrien, as will any additional recommendations warranted by these results. Jenna Obrien will receive a summary of her genetic counseling visit and a copy of her results once available. This information will also be available in Epic. We encouraged Jenna Obrien to remain in contact with cancer genetics annually so that we can continuously update the family history and inform her of any changes in cancer genetics and testing that may be of benefit for her family. Jenna Obrien questions were answered to her satisfaction today. Our contact information was provided should additional questions or concerns arise.  Lastly, we encouraged Jenna Obrien to remain in contact with cancer genetics annually so that we can continuously update the family history and inform her of any changes in cancer genetics and testing that may be of benefit for this family.   Ms.  Obrien questions were answered to her satisfaction today. Our contact information was provided should additional questions or concerns arise. Thank you for the referral and allowing [place] to share in the care of your patient.   Karen P. Florene Glen, [place], [facility] Certified Genetic  Counselor [email or web address] phone: [phone]  The patient was seen for a total of 50 minutes in face-to-face genetic counseling.  This patient was discussed with Drs. Magrinat, Lindi Adie and/or Burr Medico who agrees with the above.    _______________________________________________________________________ For Office Staff:  Number of people involved in session: 1 Was an Intern/ student involved with case: no

## 2017-03-05 ENCOUNTER — Emergency Department (HOSPITAL_BASED_OUTPATIENT_CLINIC_OR_DEPARTMENT_OTHER): Payer: Managed Care, Other (non HMO)

## 2017-03-05 ENCOUNTER — Emergency Department (HOSPITAL_BASED_OUTPATIENT_CLINIC_OR_DEPARTMENT_OTHER)
Admission: EM | Admit: 2017-03-05 | Discharge: 2017-03-05 | Disposition: A | Discharge status: Home / Self Care | Payer: Managed Care, Other (non HMO) | Attending: Emergency Medicine | Admitting: Emergency Medicine

## 2017-03-05 ENCOUNTER — Encounter (HOSPITAL_BASED_OUTPATIENT_CLINIC_OR_DEPARTMENT_OTHER): Payer: Self-pay

## 2017-03-05 DIAGNOSIS — S2242XA Multiple fractures of ribs, left side, initial encounter for closed fracture: Secondary | ICD-10-CM | POA: Insufficient documentation

## 2017-03-05 DIAGNOSIS — Y92008 Other place in unspecified non-institutional (private) residence as the place of occurrence of the external cause: Secondary | ICD-10-CM | POA: Insufficient documentation

## 2017-03-05 DIAGNOSIS — S29001A Unspecified injury of muscle and tendon of front wall of thorax, initial encounter: Secondary | ICD-10-CM | POA: Diagnosis present

## 2017-03-05 DIAGNOSIS — W01198A Fall on same level from slipping, tripping and stumbling with subsequent striking against other object, initial encounter: Secondary | ICD-10-CM | POA: Insufficient documentation

## 2017-03-05 DIAGNOSIS — Y939 Activity, unspecified: Secondary | ICD-10-CM | POA: Insufficient documentation

## 2017-03-05 DIAGNOSIS — Y999 Unspecified external cause status: Secondary | ICD-10-CM | POA: Diagnosis not present

## 2017-03-05 DIAGNOSIS — W19XXXA Unspecified fall, initial encounter: Secondary | ICD-10-CM

## 2017-03-05 MED ORDER — IBUPROFEN 800 MG PO TABS: 800.0000 mg | ORAL_TABLET | Freq: Three times a day (TID) | ORAL | 0 refills | Status: DC | PRN

## 2017-03-05 MED ORDER — OXYCODONE-ACETAMINOPHEN 5-325 MG PO TABS: 2.0000 | ORAL_TABLET | ORAL | 0 refills | Status: DC | PRN

## 2017-03-05 MED ORDER — DOCUSATE SODIUM 100 MG PO CAPS: 100.0000 mg | ORAL_CAPSULE | Freq: Two times a day (BID) | ORAL | 0 refills | Status: DC

## 2017-03-05 MED ORDER — LIDOCAINE 5 % EX PTCH: 1.0000 | MEDICATED_PATCH | CUTANEOUS | 0 refills | Status: DC

## 2017-03-05 NOTE — Discharge Instructions (Signed)
Your workup today showed that you have a fracture to one or more ribs.  Unfortunately this type of injury hurts but there is no way to fix it immediately; it must heal over time.  Be sure to take plenty of deep breaths so that you get rid of the "bad air" in your lungs.  If you are given a device called an incentive spirometer, please use it as recommended.  Unless you have been told by your doctor not to do so, we recommend you take ibuprofen 600 mg 3 times daily with meals for no more than 5 days.  You can also take Tylenol 1000 mg every 6 hours for pain.  Follow-up at the clinics or with the doctors described in this paperwork.  Return to the emergency department if he develop new or worsening symptoms that concern you.   Rib Fracture A rib fracture is a break or crack in one of the bones of the ribs. The ribs are a group of Cyla Haluska, curved bones that wrap around your chest and attach to your spine. They protect your lungs and other organs in the chest cavity. A broken or cracked rib is often painful, but most do not cause other problems. Most rib fractures heal on their own over time. However, rib fractures can be more serious if multiple ribs are broken or if broken ribs move out of place and push against other structures. CAUSES  A direct blow to the chest. For example, this could happen during contact sports, a car accident, or a fall against a hard object. Repetitive movements with high force, such as pitching a baseball or having severe coughing spells. SYMPTOMS  Pain when you breathe in or cough. Pain when someone presses on the injured area. DIAGNOSIS  Your caregiver will perform a physical exam. Various imaging tests may be ordered to confirm the diagnosis and to look for related injuries. These tests may include a chest X-ray, computed tomography (CT), magnetic resonance imaging (MRI), or a bone scan. TREATMENT  Rib fractures usually heal on their own in 1-3 months. The longer healing  period is often associated with a continued cough or other aggravating activities. During the healing period, pain control is very important. Medication is usually given to control pain. Hospitalization or surgery may be needed for more severe injuries, such as those in which multiple ribs are broken or the ribs have moved out of place.  HOME CARE INSTRUCTIONS  Avoid strenuous activity and any activities or movements that cause pain. Be careful during activities and avoid bumping the injured rib. Gradually increase activity as directed by your caregiver. Only take over-the-counter or prescription medications as directed by your caregiver. Do not take other medications without asking your caregiver first. Apply ice to the injured area for the first 1-2 days after you have been treated or as directed by your caregiver. Applying ice helps to reduce inflammation and pain. Put ice in a plastic bag. Place a towel between your skin and the bag.   Leave the ice on for 15-20 minutes at a time, every 2 hours while you are awake. Perform deep breathing as directed by your caregiver. This will help prevent pneumonia, which is a common complication of a broken rib. Your caregiver may instruct you to: Take deep breaths several times a day. Try to cough several times a day, holding a pillow against the injured area. Use a device called an incentive spirometer to practice deep breathing several times a day. Drink   enough fluids to keep your urine clear or pale yellow. This will help you avoid constipation.   Do not wear a rib belt or binder. These restrict breathing, which can lead to pneumonia.   SEEK IMMEDIATE MEDICAL CARE IF:  You have a fever.   You have difficulty breathing or shortness of breath.   You develop a continual cough, or you cough up thick or bloody sputum. You feel sick to your stomach (nausea), throw up (vomit), or have abdominal pain.   You have worsening pain not controlled with medications.    MAKE SURE YOU: Understand these instructions. Will watch your condition. Will get help right away if you are not doing well or get worse. Document Released: 06/19/2005 Document Revised: 02/19/2013 Document Reviewed: 08/21/2012 ExitCare Patient Information 2015 [employer]. This information is not intended to replace advice given to you by your health care provider. Make sure you discuss any questions you have with your health care provider.   

## 2017-03-05 NOTE — ED Provider Notes (Signed)
Emergency Department Provider Note   I have reviewed the triage vital signs and the nursing notes.   HISTORY  Chief Complaint Fall   HPI Jenna Obrien is a 61 y.o. female presents to the emergency department for evaluation of left, posterior chest wall pain after slipping and falling in the garage 2 days ago. Patient fell backwards and hit the bumper of her car. No head injury or loss of consciousness. She has had severe pain in the left flank since that time. She reports negative x-rays of the chest yesterday at urgent care. She was prescribed Flexeril with no relief in symptoms. No radiation of pain. No fever or chills. Pain is severe, intermittent, and worse with movement.   Past Medical History:  Diagnosis Date  . Abnormal Pap smear of cervix    in 20's  . Anxiety   . Depression   . DES exposure in utero   . Dyspareunia   . Family history of colon cancer   . Family history of prostate cancer   . Family history of uterine cancer   . Fibroid   . Heart murmur   . Osteopenia     Patient Active Problem List   Diagnosis Date Noted  . Family history of colon cancer   . Family history of uterine cancer   . Family history of prostate cancer   . Osteopenia 01/13/2013    Past Surgical History:  Procedure Laterality Date  . ABDOMINAL HYSTERECTOMY  08/1998   TAH--Due to fibroids--Dr. Ubaldo Glassing  . AUGMENTATION MAMMAPLASTY    . BREAST EXCISIONAL BIOPSY      Current Outpatient Rx  . Order #: [id number] Class: Historical Med  . Order #: [id number] Class: Historical Med  . Order #: [id number] Class: Historical Med  . Order #: [id number] Class: Print  . Order #: [id number] Class: Print  . Order #: [id number] Class: Print  . Order #: [id number] Class: Historical Med  . Order #: [id number] Class: Print  . Order #: [id number] Class: Historical Med  . Order #: [id number] Class: Historical Med  . Order #: [id number] Class: Historical Med  . Order #: [id number] Class: Historical Med     Allergies Patient has no known allergies.  Family History  Problem Relation Age of Onset  . Diabetes Mother   . Hypertension Mother   . Thyroid disease Mother   . AAA (abdominal aortic aneurysm) Mother   . Lung cancer Father 49  . Prostate cancer Father        dx in his 39s  . Colon cancer Paternal Aunt        dx in her 48s  . Colon cancer Paternal Uncle        colon cancer vs colon polyps  . Colon cancer Paternal Grandfather        dx over 33s  . Head & neck cancer Maternal Uncle        oral cancer  . Heart attack Maternal Grandmother   . Heart attack Maternal Grandfather   . Lung cancer Paternal Uncle        dx in his 55s-50s  . Colon cancer Cousin        paternal cousin dx in her 29s-40  . Uterine cancer Cousin        paternal cousin    Social History Social History  Substance Use Topics  . Smoking status: Never Smoker  . Smokeless tobacco: Never Used  . Alcohol use Yes     Comment: 2-3 glasses of wine per night    Review  of Systems  Constitutional: No fever/chills Eyes: No visual changes. ENT: No sore throat. Cardiovascular: Denies chest pain. Respiratory: Denies shortness of breath. Gastrointestinal: No abdominal pain.  No nausea, no vomiting.  No diarrhea.  No constipation. Genitourinary: Negative for dysuria. Musculoskeletal: Negative for back pain. Positive left chest wall pain.  Skin: Negative for rash. Neurological: Negative for headaches, focal weakness or numbness.  10-point ROS otherwise negative.  ____________________________________________   PHYSICAL EXAM:  VITAL SIGNS: ED Triage Vitals  Enc Vitals Group     BP 03/05/17 1216 (!) 149/61     Pulse Rate 03/05/17 1216 87     Resp 03/05/17 1216 18     Temp 03/05/17 1216 98.5 F (36.9 C)     Temp Source 03/05/17 1216 Oral     SpO2 03/05/17 1216 100 %     Weight 03/05/17 1215 115 lb (52.2 kg)     Height 03/05/17 1215 5\' 4"  (1.626 m)     Pain Score 03/05/17 1213 10     Constitutional: Alert and oriented. Well appearing and in no acute distress. Eyes: Conjunctivae are normal. Head: Atraumatic. Nose: No congestion/rhinnorhea. Mouth/Throat: Mucous membranes are moist.  Neck: No stridor.   Cardiovascular: Normal rate, regular rhythm. Good peripheral circulation. Grossly normal heart sounds.   Respiratory: Normal respiratory effort.  No retractions. Lungs CTAB. Gastrointestinal: Soft and nontender. No distention.  Musculoskeletal: No lower extremity tenderness nor edema. No gross deformities of extremities. Positive point tenderness to palpation over the left lateral/posterior chest wall. No bruising. No paradoxical movement.  Neurologic:  Normal speech and language. No gross focal neurologic deficits are appreciated.  Skin:  Skin is warm, dry and intact. No rash noted.   ____________________________________________  [id number]  Dg Ribs Unilateral W/chest Left  Result Date: 03/05/2017 CLINICAL DATA:  Left flank and mid and lower back pain following a fall 2 days ago. EXAM: LEFT RIBS AND CHEST - 3+ VIEW COMPARISON:  None. FINDINGS: Normal sized heart. Minimal bibasilar atelectasis. Otherwise, clear lungs with normal vascularity. Moderate thoracolumbar scoliosis. Bilateral breast implants. Minimally displaced left eighth and ninth posterior rib fractures without pneumothorax. IMPRESSION: 1. Minimally displaced left eighth and ninth posterior rib fractures without pneumothorax. 2. Minimal bibasilar atelectasis. Electronically Signed   By: Claudie Revering M.D.   On: 03/05/2017 13:19    ____________________________________________   PROCEDURES  Procedure(s) performed:   Procedures  None ____________________________________________   INITIAL IMPRESSION / ASSESSMENT AND PLAN / ED COURSE  Pertinent labs & imaging results that were available during my care of the patient were reviewed by me and considered in my medical decision making (see chart for  details).  Patient presents to the ED with chest wall pain s/p fall several days ago. Point tender on exam with suspected diagnosis of non-displaced ribs fractures on dedicated rib series. Given incentive spirometer in the ED along with pain medication and stool softener. Discussed using opiate for only breakthrough pain. Discussed not driving while taking these medications.   At this time, I do not feel there is any life-threatening condition present. I have reviewed and discussed all results (EKG, imaging, lab, urine as appropriate), exam findings with patient. I have reviewed nursing notes and appropriate previous records.  I feel the patient is safe to be discharged home without further emergent workup. Discussed usual and customary return precautions. Patient and family (if present) verbalize understanding and are comfortable with this plan.  Patient will follow-up with their primary care provider. If they do not have a  primary care provider, information for follow-up has been provided to them. All questions have been answered.  ____________________________________________  FINAL CLINICAL IMPRESSION(S) / ED DIAGNOSES  Final diagnoses:  Fall, initial encounter  Closed fracture of multiple ribs of left side, initial encounter     MEDICATIONS GIVEN DURING THIS VISIT:  Medications - No data to display   NEW OUTPATIENT MEDICATIONS STARTED DURING THIS VISIT:  Discharge Medication List as of 03/05/2017  1:38 PM    START taking these medications   Details  docusate sodium (COLACE) 100 MG capsule Take 1 capsule (100 mg total) by mouth every 12 (twelve) hours., Starting Mon 03/05/2017, Print    ibuprofen (ADVIL,MOTRIN) 800 MG tablet Take 1 tablet (800 mg total) by mouth every 8 (eight) hours as needed., Starting Mon 03/05/2017, Print    lidocaine (LIDODERM) 5 % Place 1 patch onto the skin daily. Remove & Discard patch within 12 hours or as directed by MD, Starting Mon 03/05/2017, Print     oxyCODONE-acetaminophen (PERCOCET/ROXICET) 5-325 MG tablet Take 2 tablets by mouth every 4 (four) hours as needed for severe pain., Starting Mon 03/05/2017, Print        Note:  This document was prepared using Dragon voice recognition software and may include unintentional dictation errors.  Nanda Quinton, MD Emergency Medicine    Merisa Julio, [facility], MD 03/05/17 [phone]

## 2017-03-05 NOTE — ED Notes (Signed)
Pt sts no relief with extra strength tylenol and flexeril. Sts she has taken 3 flexeril since yesterday with no relief.

## 2017-03-05 NOTE — ED Triage Notes (Signed)
Slipped/fell 2 days ago-pain to left flank/mid/low back-was seen at [facility] yesterday-with neg xray-c/o muscle spasms and increase in pain-slow stead gait-grimace with movement

## 2017-03-06 ENCOUNTER — Encounter: Payer: Self-pay | Admitting: Genetic Counselor

## 2017-03-06 ENCOUNTER — Ambulatory Visit: Payer: Self-pay | Admitting: Genetic Counselor

## 2017-03-06 ENCOUNTER — Telehealth: Payer: Self-pay | Admitting: Genetic Counselor

## 2017-03-06 DIAGNOSIS — Z800 Family history of malignant neoplasm of digestive organs: Secondary | ICD-10-CM

## 2017-03-06 DIAGNOSIS — Z1379 Encounter for other screening for genetic and chromosomal anomalies: Secondary | ICD-10-CM

## 2017-03-06 DIAGNOSIS — Z8042 Family history of malignant neoplasm of prostate: Secondary | ICD-10-CM

## 2017-03-06 DIAGNOSIS — Z8049 Family history of malignant neoplasm of other genital organs: Secondary | ICD-10-CM

## 2017-03-06 NOTE — Telephone Encounter (Signed)
Revealed negative genetic testing.  Discussed that we do not know why there is cancer in the family. It could be due to a different gene that we are not testing, or maybe our current technology may not be able to pick something up.  It will be important for her to keep in contact with genetics to keep up with whether additional testing may be needed.  

## 2017-03-06 NOTE — Progress Notes (Signed)
HPI: [name] was previously seen in the [facility] due to a family history of cancer and concerns regarding a hereditary predisposition to cancer. Please refer to our prior cancer genetics clinic note for more information regarding [name]'s medical, social and family histories, and our assessment and recommendations, at the time. [name] recent genetic test results were disclosed to her, as were recommendations warranted by these results. These results and recommendations are discussed in more detail below.  CANCER HISTORY:   No history exists.    FAMILY HISTORY:  We obtained a detailed, 4-generation family history.  Significant diagnoses are listed below: Family History  Problem Relation Age of Onset  . Diabetes Mother   . Hypertension Mother   . Thyroid disease Mother   . AAA (abdominal aortic aneurysm) Mother   . Lung cancer Father 63  . Prostate cancer Father        dx in his 7s  . Colon cancer Paternal Aunt        dx in her 71s  . Colon cancer Paternal Uncle        colon cancer vs colon polyps  . Colon cancer Paternal Grandfather        dx over 66s  . Head & neck cancer Maternal Uncle        oral cancer  . Heart attack Maternal Grandmother   . Heart attack Maternal Grandfather   . Lung cancer Paternal Uncle        dx in his 25s-50s  . Colon cancer Cousin        paternal cousin dx in her 24s-40  . Uterine cancer Cousin        paternal cousin    The patient has two adopted daughters.  She is an only child.  Her mother died at age 62 due to an AAA.  Her mother took DES during the pregnancy with the patient.  Her mother had a paternal half sister and a maternal half brother.  The sister is alive with no children.  The brother died of an oral cancer.  Both maternal grandparents died of a heart attack.  The patient's father developed prostate cancer in his 29's and lung cancer at 52.  He had two brothers and one sister.  His sister had  colon cancer in her 32's, and had a daughter with uterine cancer.  One brother died of lung cancer and he had a daughter die of colon cancer in her 30's-40's.  The remaining brother had colon polyps and possibly colon cancer.  The paternal grandfather died of colon cancer and the grandmother died in a car accident.  [name] is unaware of previous family history of genetic testing for hereditary cancer risks. Patient's maternal ancestors are of [place] descent, and paternal ancestors are of [place] descent. There is no reported Ashkenazi Jewish ancestry. There is no known consanguinity.  GENETIC TEST RESULTS: Genetic testing reported out on February 16, 2017 through the Common Hereditary cancer panel found no deleterious mutations.  The Hereditary Gene Panel offered by [facility] includes sequencing and/or deletion duplication testing of the following 46 genes: APC, ATM, AXIN2, BARD1, BMPR1A, BRCA1, BRCA2, BRIP1, CDH1, CDKN2A (p14ARF), CDKN2A (p16INK4a), CHEK2, CTNNA1, DICER1, EPCAM (Deletion/duplication testing only), GREM1 (promoter region deletion/duplication testing only), KIT, MEN1, MLH1, MSH2, MSH3, MSH6, MUTYH, NBN, NF1, NHTL1, PALB2, PDGFRA, PMS2, POLD1, POLE, PTEN, RAD50, RAD51C, RAD51D, SDHB, SDHC, SDHD, SMAD4, SMARCA4. STK11, TP53, TSC1, TSC2, and VHL.  The following genes  were evaluated for sequence changes only: SDHA and HOXB13 c.251G>A variant only.  The test report has been scanned into EPIC and is located under the Molecular Pathology section of the Results Review tab.   We discussed with [name] that since the current genetic testing is not perfect, it is possible there may be a gene mutation in one of these genes that current testing cannot detect, but that chance is small. We also discussed, that it is possible that another gene that has not yet been discovered, or that we have not yet tested, is responsible for the cancer diagnoses in the family, and it is, therefore, important to  remain in touch with cancer genetics in the future so that we can continue to offer [name] the most up to date genetic testing.     CANCER SCREENING RECOMMENDATIONS: This normal result is reassuring and indicates that [name] does not likely have an increased risk of cancer due to a mutation in one of these genes.  We, therefore, recommended  [name] continue to follow the cancer screening guidelines provided by her primary healthcare providers.   RECOMMENDATIONS FOR FAMILY MEMBERS: Individuals in this family might be at some increased risk of developing cancer, over the general population risk, simply due to the family history of cancer. We recommended women in this family have a yearly mammogram beginning at age 50, or 50 years younger than the earliest onset of cancer, an annual clinical breast exam, and perform monthly breast self-exams. Women in this family should also have a gynecological exam as recommended by their primary provider. All family members should have a colonoscopy by age 16.  FOLLOW-UP: Lastly, we discussed with [name] that cancer genetics is a rapidly advancing field and it is possible that new genetic tests will be appropriate for her and/or her family members in the future. We encouraged her to remain in contact with cancer genetics on an annual basis so we can update her personal and family histories and let her know of advances in cancer genetics that may benefit this family.   Our contact number was provided. [name] questions were answered to her satisfaction, and she knows she is welcome to call us at anytime with additional questions or concerns.   Roma Kayser, MS, [facility] Certified Genetic Counselor [email or web address]

## 2017-12-24 ENCOUNTER — Encounter: Payer: Self-pay | Admitting: Obstetrics and Gynecology

## 2017-12-24 ENCOUNTER — Other Ambulatory Visit: Payer: Self-pay

## 2017-12-24 ENCOUNTER — Other Ambulatory Visit (HOSPITAL_COMMUNITY)
Admission: RE | Admit: 2017-12-24 | Discharge: 2017-12-24 | Disposition: A | Discharge status: Home / Self Care | Payer: Managed Care, Other (non HMO) | Source: Ambulatory Visit | Attending: Obstetrics and Gynecology | Admitting: Obstetrics and Gynecology

## 2017-12-24 ENCOUNTER — Ambulatory Visit (INDEPENDENT_AMBULATORY_CARE_PROVIDER_SITE_OTHER): Payer: Managed Care, Other (non HMO) | Admitting: Obstetrics and Gynecology

## 2017-12-24 VITALS — BP 124/58 | HR 80 | Resp 14 | Ht 63.5 in | Wt 125.0 lb

## 2017-12-24 DIAGNOSIS — Z01419 Encounter for gynecological examination (general) (routine) without abnormal findings: Secondary | ICD-10-CM

## 2017-12-24 DIAGNOSIS — N952 Postmenopausal atrophic vaginitis: Secondary | ICD-10-CM | POA: Insufficient documentation

## 2017-12-24 DIAGNOSIS — Z801 Family history of malignant neoplasm of trachea, bronchus and lung: Secondary | ICD-10-CM | POA: Insufficient documentation

## 2017-12-24 DIAGNOSIS — Z8049 Family history of malignant neoplasm of other genital organs: Secondary | ICD-10-CM | POA: Diagnosis not present

## 2017-12-24 DIAGNOSIS — M810 Age-related osteoporosis without current pathological fracture: Secondary | ICD-10-CM | POA: Insufficient documentation

## 2017-12-24 DIAGNOSIS — Z90710 Acquired absence of both cervix and uterus: Secondary | ICD-10-CM | POA: Insufficient documentation

## 2017-12-24 DIAGNOSIS — Z800 Family history of malignant neoplasm of digestive organs: Secondary | ICD-10-CM | POA: Insufficient documentation

## 2017-12-24 DIAGNOSIS — L409 Psoriasis, unspecified: Secondary | ICD-10-CM | POA: Diagnosis not present

## 2017-12-24 DIAGNOSIS — Z1151 Encounter for screening for human papillomavirus (HPV): Secondary | ICD-10-CM | POA: Insufficient documentation

## 2017-12-24 NOTE — Progress Notes (Addendum)
62 y.o. G0P0 Separated Caucasian female here for annual exam.    Had negative genetic testing.   Not taking calcium or vit D.   Separated and one child living with her.  Feeling more at peace now.  Will be changing her work as well.  No current sexual partner.   PCP:   Marton Redwood, MD  Patient's last menstrual period was 08/03/1998.           Sexually active: No.  The current method of family planning is status post hysterectomy.    Exercising: No.  The patient does not participate in regular exercise at present. Smoker:  no  Health Maintenance: Pap: 12-20-16 Neg:Neg HR HPV, 12-17-15 Neg:Neg HR HPV,  History of abnormal Pap:   Yes in her 91s.  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 MMG: 01-31-17 Density C/Neg/BiRads1 Colonoscopy: 2015, next due 2020  BMD:  2015  Result : Osteoporosis. Took Fosamax & had reflux symptoms. Dr.Shaw monitoring. On Prolia. Thinks her BMD is due this year.  TDaP:  PCP Gardasil:   no HIV: no Hep C: no Screening Labs:  Hb today: PCP, Urine today: not collected   reports that she has never smoked. She has never used smokeless tobacco. She reports that she drinks alcohol. She reports that she does not use drugs.  Past Medical History:  Diagnosis Date  . Abnormal Pap smear of cervix    in 20's  . Anxiety   . Depression   . DES exposure in utero   . Dyspareunia   . Family history of colon cancer   . Family history of prostate cancer   . Family history of uterine cancer   . Fibroid   . Heart murmur   . Osteopenia     Past Surgical History:  Procedure Laterality Date  . ABDOMINAL HYSTERECTOMY  08/1998   TAH--Due to fibroids--Dr. Ubaldo Glassing  . AUGMENTATION MAMMAPLASTY    . BREAST EXCISIONAL BIOPSY      Current Outpatient Medications  Medication Sig Dispense Refill  . NASACORT ALLERGY 24HR 55 MCG/ACT AERO nasal inhaler Place 1 spray into both nostrils daily.  6  . pantoprazole (PROTONIX) 40 MG tablet     . PROLIA 60 MG/ML SOLN injection     . venlafaxine XR (EFFEXOR  XR) 150 MG 24 hr capsule Take 150 mg by mouth. Takes 2 tablets daily     No current facility-administered medications for this visit.     Family History  Problem Relation Age of Onset  . Diabetes Mother   . Hypertension Mother   . Thyroid disease Mother   . AAA (abdominal aortic aneurysm) Mother   . Lung cancer Father 58  . Prostate cancer Father        dx in his 27s  . Colon cancer Paternal Aunt        dx in her 20s  . Colon cancer Paternal Uncle        colon cancer vs colon polyps  . Colon cancer Paternal Grandfather        dx over 14s  . Head & neck cancer Maternal Uncle        oral cancer  . Heart attack Maternal Grandmother   . Heart attack Maternal Grandfather   . Lung cancer Paternal Uncle        dx in his 48s-50s  . Colon cancer Cousin        paternal cousin dx in her 47s-40  . Uterine cancer Cousin  paternal cousin    Review of Systems  Constitutional: Negative.   HENT: Negative.   Eyes: Negative.   Respiratory: Negative.   Cardiovascular: Positive for palpitations.  Gastrointestinal: Negative.   Endocrine: Negative.   Genitourinary: Negative.        Heartburn    Musculoskeletal: Negative.   Skin: Negative.   Allergic/Immunologic: Negative.   Neurological: Negative.   Hematological: Negative.   Psychiatric/Behavioral: Negative.   had normal EKG with her PCP.  Exam:   BP (!) 124/58 (BP Location: Right Arm, Patient Position: Sitting, Cuff Size: Normal)   Pulse 80   Resp 14   Ht 5' 3.5" (1.613 m)   Wt 125 lb (56.7 kg)   LMP 08/03/1998   BMI 21.80 kg/m     General appearance: alert, cooperative and appears stated age Head: Normocephalic, without obvious abnormality, atraumatic Neck: no adenopathy, supple, symmetrical, trachea midline and thyroid normal to inspection and palpation Lungs: clear to auscultation bilaterally Breasts: bilateral implants, normal appearance, no masses or tenderness, No nipple retraction or dimpling, No nipple  discharge or bleeding, No axillary or supraclavicular adenopathy.  Left axillary sebaceous cyst 8 mm, nontender.  Heart: regular rate and rhythm Abdomen: soft, non-tender; no masses, no organomegaly Extremities: extremities normal, atraumatic, no cyanosis or edema Skin: Skin color, texture, turgor normal.  Erythematous patches on her feet. Lymph nodes: Cervical, supraclavicular, and axillary nodes normal. No abnormal inguinal nodes palpated Neurologic: Grossly normal  Pelvic: External genitalia:  no lesions              Urethra:  normal appearing urethra with no masses, tenderness or lesions              Bartholins and Skenes: normal                 Vagina: normal appearing vagina with normal color and discharge, no lesions              Cervix:  Absent.               Pap taken: Yes.   Bimanual Exam:  Uterus:  absent              Adnexa: no mass, fullness, tenderness              Rectal exam: Yes.  .  Confirms.              Anus:  normal sphincter tone, no lesions  Chaperone was present for exam.  Assessment:   Well woman visit with normal exam. Status post TAH for fibroids. Ovaries remain.  Status post bilateral breast implants.  Vaginal atrophy.  Osteoporosis.  Starting Prolia.  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  Hx abnormal pap.  Remote hx cryotherapy.  FH colon, lung, and uterine cancer.  Negative genetic testing.  Psoriasis.   Plan: Mammogram screening. Recommended self breast awareness. Pap and HR HPV as above. Guidelines for Calcium, Vitamin D, regular exercise program including cardiovascular and weight bearing exercise. Pap and HR HPV done.  Will get most recent BMD form her PCP. Follow up annually and prn.   After visit summary provided.

## 2017-12-26 LAB — CYTOLOGY - PAP: HPV (WINDOPATH): NOT DETECTED

## 2018-02-11 ENCOUNTER — Other Ambulatory Visit: Payer: Self-pay | Admitting: Internal Medicine

## 2018-02-11 DIAGNOSIS — R002 Palpitations: Secondary | ICD-10-CM

## 2018-02-12 ENCOUNTER — Ambulatory Visit (INDEPENDENT_AMBULATORY_CARE_PROVIDER_SITE_OTHER): Payer: Managed Care, Other (non HMO)

## 2018-02-12 DIAGNOSIS — R002 Palpitations: Secondary | ICD-10-CM

## 2018-03-25 ENCOUNTER — Ambulatory Visit: Payer: Managed Care, Other (non HMO) | Admitting: Cardiology

## 2018-03-25 ENCOUNTER — Encounter

## 2018-03-27 ENCOUNTER — Other Ambulatory Visit: Payer: Self-pay | Admitting: Internal Medicine

## 2018-03-27 DIAGNOSIS — Z1231 Encounter for screening mammogram for malignant neoplasm of breast: Secondary | ICD-10-CM

## 2018-04-28 DIAGNOSIS — F329 Major depressive disorder, single episode, unspecified: Secondary | ICD-10-CM

## 2018-04-28 DIAGNOSIS — F0390 Unspecified dementia without behavioral disturbance: Secondary | ICD-10-CM | POA: Insufficient documentation

## 2018-04-28 DIAGNOSIS — F411 Generalized anxiety disorder: Secondary | ICD-10-CM | POA: Insufficient documentation

## 2018-04-28 DIAGNOSIS — F0393 Unspecified dementia, unspecified severity, with mood disturbance: Secondary | ICD-10-CM

## 2018-04-29 ENCOUNTER — Ambulatory Visit
Admission: RE | Admit: 2018-04-29 | Discharge: 2018-04-29 | Disposition: A | Discharge status: Home / Self Care | Payer: Managed Care, Other (non HMO) | Source: Ambulatory Visit | Attending: Internal Medicine | Admitting: Internal Medicine

## 2018-04-29 DIAGNOSIS — Z1231 Encounter for screening mammogram for malignant neoplasm of breast: Secondary | ICD-10-CM

## 2018-05-02 ENCOUNTER — Other Ambulatory Visit: Payer: Self-pay | Admitting: Internal Medicine

## 2018-05-02 DIAGNOSIS — E785 Hyperlipidemia, unspecified: Secondary | ICD-10-CM

## 2018-05-09 ENCOUNTER — Ambulatory Visit: Payer: Self-pay | Admitting: Psychiatry

## 2018-05-29 ENCOUNTER — Ambulatory Visit
Admission: RE | Admit: 2018-05-29 | Discharge: 2018-05-29 | Disposition: A | Discharge status: Home / Self Care | Payer: Managed Care, Other (non HMO) | Source: Ambulatory Visit | Attending: Internal Medicine | Admitting: Internal Medicine

## 2018-05-29 DIAGNOSIS — E785 Hyperlipidemia, unspecified: Secondary | ICD-10-CM

## 2018-06-24 ENCOUNTER — Ambulatory Visit: Payer: 59 | Admitting: Psychiatry

## 2018-06-24 ENCOUNTER — Encounter: Payer: Self-pay | Admitting: Psychiatry

## 2018-06-24 DIAGNOSIS — F331 Major depressive disorder, recurrent, moderate: Secondary | ICD-10-CM

## 2018-06-24 DIAGNOSIS — F411 Generalized anxiety disorder: Secondary | ICD-10-CM

## 2018-06-24 DIAGNOSIS — Z8659 Personal history of other mental and behavioral disorders: Secondary | ICD-10-CM

## 2018-06-24 MED ORDER — VENLAFAXINE HCL ER 150 MG PO CP24: 300.0000 mg | ORAL_CAPSULE | Freq: Every day | ORAL | 3 refills | Status: DC

## 2018-06-24 NOTE — Progress Notes (Signed)
Jenna Obrien [id number] 17-Mar-1956 62 y.o.  Subjective:   Patient ID:  Jenna Obrien is a 62 y.o. (DOB Nov 30, 1955) female.  Chief Complaint:  Chief Complaint  Patient presents with  . Follow-up    multiple dxes    HPI Jenna Obrien presents to the office today for follow-up of anxiety and depression.  Last visit a year.  Eventful year.  Sep summer 2018.  Div Oct.  Youngest D lives with her and oldest D lives with H.  Job change.  Doing largely OK.  Occ moments of emotional distress.  Patient reports stable mood and denies depressed or irritable moods.  Patient denies any recent difficulty with anxiety.  Patient denies difficulty with sleep initiation or maintenance. Denies appetite disturbance.  Patient reports that energy and motivation have been good.  Patient denies any difficulty with concentration.  Patient denies any suicidal ideation.     Review of Systems:  Review of Systems  Gastrointestinal:       Heartburn   Neurological: Negative for tremors and weakness.  Psychiatric/Behavioral: Negative for agitation, behavioral problems, confusion, decreased concentration, dysphoric mood, hallucinations, self-injury, sleep disturbance and suicidal ideas. The patient is not nervous/anxious and is not hyperactive.     Medications: I have reviewed the patient's current medications.  Current Outpatient Medications  Medication Sig Dispense Refill  . NASACORT ALLERGY 24HR 55 MCG/ACT AERO nasal inhaler Place 1 spray into both nostrils daily.  6  . pantoprazole (PROTONIX) 40 MG tablet     . PROLIA 60 MG/ML SOLN injection     . venlafaxine XR (EFFEXOR XR) 150 MG 24 hr capsule Take 2 capsules (300 mg total) by mouth daily. Takes 2 tablets daily 180 capsule 3  . Vitamin D, Ergocalciferol, (DRISDOL) 1.25 MG (50000 UT) CAPS capsule Take 50,000 Units by mouth every 7 (seven) days.     No current facility-administered medications for this visit.     Medication Side Effects:  None  Allergies: No Known Allergies  Past Medical History:  Diagnosis Date  . Abnormal Pap smear of cervix    in 20's  . Anxiety   . Depression   . DES exposure in utero   . Dyspareunia   . Family history of colon cancer   . Family history of prostate cancer   . Family history of uterine cancer   . Fibroid   . Heart murmur   . Osteopenia     Family History  Problem Relation Age of Onset  . Diabetes Mother   . Hypertension Mother   . Thyroid disease Mother   . AAA (abdominal aortic aneurysm) Mother   . Lung cancer Father 66  . Prostate cancer Father        dx in his 6s  . Colon cancer Paternal Aunt        dx in her 14s  . Colon cancer Paternal Uncle        colon cancer vs colon polyps  . Colon cancer Paternal Grandfather        dx over 51s  . Head & neck cancer Maternal Uncle        oral cancer  . Heart attack Maternal Grandmother   . Heart attack Maternal Grandfather   . Lung cancer Paternal Uncle        dx in his 55s-50s  . Colon cancer Cousin        paternal cousin dx in her 68s-40  . Uterine cancer Cousin  paternal cousin    Social History   Socioeconomic History  . Marital status: Married    Spouse name: Not on file  . Number of children: Not on file  . Years of education: Not on file  . Highest education level: Not on file  Occupational History  . Not on file  Social Needs  . Financial resource strain: Not on file  . Food insecurity:    Worry: Not on file    Inability: Not on file  . Transportation needs:    Medical: Not on file    Non-medical: Not on file  Tobacco Use  . Smoking status: Never Smoker  . Smokeless tobacco: Never Used  Substance and Sexual Activity  . Alcohol use: Yes    Comment: 2-3 glasses of wine per night  . Drug use: No  . Sexual activity: Not on file  Lifestyle  . Physical activity:    Days per week: Not on file    Minutes per session: Not on file  . Stress: Not on file  Relationships  . Social  connections:    Talks on phone: Not on file    Gets together: Not on file    Attends religious service: Not on file    Active member of club or organization: Not on file    Attends meetings of clubs or organizations: Not on file    Relationship status: Not on file  . Intimate partner violence:    Fear of current or ex partner: Not on file    Emotionally abused: Not on file    Physically abused: Not on file    Forced sexual activity: Not on file  Other Topics Concern  . Not on file  Social History Narrative  . Not on file    Past Medical History, Surgical history, Social history, and Family history were reviewed and updated as appropriate.   Please see review of systems for further details on the patient's review from today.   Objective:   Physical Exam:  LMP 08/03/1998   Physical Exam Constitutional:      General: She is not in acute distress.    Appearance: Normal appearance. She is well-developed.  Musculoskeletal:        General: No deformity.  Neurological:     Mental Status: She is alert and oriented to person, place, and time.     Motor: No tremor.     Coordination: Coordination normal.     Gait: Gait normal.  Psychiatric:        Attention and Perception: Attention and perception normal.        Mood and Affect: Mood is not anxious or depressed. Affect is not labile, blunt, angry or inappropriate.        Speech: Speech normal.        Behavior: Behavior normal.        Thought Content: Thought content normal. Thought content does not include homicidal or suicidal ideation. Thought content does not include homicidal or suicidal plan.        Cognition and Memory: Cognition normal.        Judgment: Judgment normal.     Comments: Insight intact. No auditory or visual hallucinations. No delusions.      Lab Review:  No results found for: NA, K, CL, CO2, GLUCOSE, BUN, CREATININE, CALCIUM, PROT, ALBUMIN, AST, ALT, ALKPHOS, BILITOT, GFRNONAA, GFRAA  No results found  for: WBC, RBC, HGB, HCT, PLT, MCV, MCH, MCHC, RDW, LYMPHSABS, MONOABS, EOSABS, BASOSABS  No results found for: POCLITH, LITHIUM   No results found for: PHENYTOIN, PHENOBARB, VALPROATE, CBMZ   .res Assessment: Plan:    Generalized anxiety disorder  Major depressive disorder, recurrent episode, moderate ([place])  History of eating disorder   Disc privacy concerns and Epic.  Supportive therapy on break-up of her marriage and dealing with the children.  Fairly recent divorce.  She seems to be handling this pretty well  Eating disorder symptoms are under control by her report  She is satisfied with the Effexor at controlling symptoms of depression and anxiety.  We discussed withdrawal symptoms related to Effexor because of its short half-life.  Because of stability will wait 1 year before next appointment  Lynder Parents, MD, DFAPA    Please see After Visit Summary for patient specific instructions.  Future Appointments  Date Time Provider [place]  09/02/2018 11:00 AM May, [place], [place] [facility] None  01/01/2019  2:30 PM Nunzio Cobbs, MD [place] None  06/23/2019 10:30 AM Cottle, [place] [facility] None    No orders of the defined types were placed in this encounter.     -------------------------------

## 2018-07-03 ENCOUNTER — Encounter: Payer: Self-pay | Admitting: Obstetrics and Gynecology

## 2018-07-04 ENCOUNTER — Telehealth: Payer: Self-pay | Admitting: Obstetrics and Gynecology

## 2018-07-04 NOTE — Telephone Encounter (Signed)
Hello Dr. Quincy Simmonds!    My daughter Jenna Obrien is late with her period and so we have not started her on the birth control pills. However, for the last several days she's been complaining of a lot of pain. Can we go ahead and start her on the medication even though her period hasn't started?    Thank you!    [name]  [phone]

## 2018-08-19 DIAGNOSIS — Z86010 Personal history of colonic polyps: Secondary | ICD-10-CM | POA: Insufficient documentation

## 2018-08-19 DIAGNOSIS — Z860101 Personal history of adenomatous and serrated colon polyps: Secondary | ICD-10-CM | POA: Insufficient documentation

## 2018-09-02 ENCOUNTER — Ambulatory Visit (INDEPENDENT_AMBULATORY_CARE_PROVIDER_SITE_OTHER): Payer: 59 | Admitting: Psychiatry

## 2018-09-02 ENCOUNTER — Encounter: Payer: Self-pay | Admitting: Psychiatry

## 2018-09-02 DIAGNOSIS — F411 Generalized anxiety disorder: Secondary | ICD-10-CM

## 2018-09-02 NOTE — Progress Notes (Signed)
      [facility] Counselor/Therapist Progress Note  Patient ID: Jenna Obrien, MRN: [id number],    Date: 09/02/2018  Time Spent: 49 minutes  Treatment Type: Individual Therapy  Reported Symptoms: Anxiety, stress  Mental Status Exam:  Appearance:   Well Groomed     Behavior:  Appropriate  Motor:  Normal  Speech/Language:   Clear and Coherent  Affect:  Appropriate  Mood:  anxious  Thought process:  normal  Thought content:    WNL  Sensory/Perceptual disturbances:    WNL  Orientation:  oriented to person, place, time/date and situation  Attention:  Good  Concentration:  Good  Memory:  WNL  Fund of knowledge:   Good  Insight:    Good  Judgment:   Good  Impulse Control:  Good   Risk Assessment: Danger to Self:  No Self-injurious Behavior: No Danger to Others: No Duty to Warn:no Physical Aggression / Violence:No  Access to Firearms a concern: No  Gang Involvement:No   Subjective: The client reports that she and her husband are divorced.  He is still getting treatment for his leukemia through an experimental drug trial at [facility].  Their oldest daughter has continued health issues which has kept her from attending high school.  She feels that her husband is to indulgent and as a result is not attending school. The client is having difficulty with her youngest daughter.  She is very self-sufficient and oppositional when the client tries to ask about school or inquire about upcoming deadlines.  She was looking for help in managing her relationship with her daughter. We discussed developing a written contract between both of them where they sit down together with 1 or 2 other adults she respects.  The goal would be for them to be able to get along and live successfully with each other. Client also states she notices that her daughter tends to hoard things such as food by keeping it in her room.  This seems to result from her older sister who would always take her stuff.  I  suggested that the client gave her daughter a cabinet that she can lock to store her stuff and so that she can feel safe about it.  Client thought this was a great idea.  We also discussed other ways of engaging her daughter such as using her interest in crafts as a vehicle to develop things for her birthday or Christmas gifts for family and friends.  The client agreed.  She will also contact the daughters school counselor and ask for her thoughts on engaging her daughter.   Interventions: Assertiveness/Communication, Solution-Oriented/Positive Psychology and Insight-Oriented  Diagnosis:   ICD-10-CM   1. Generalized anxiety disorder F41.1     Plan: Family contract, And that the locks, meet with school counselor.  This record has been created using [employer].  Chart creation errors have been sought, but Greenleigh Kauth not always have been located and corrected. Such creation errors do not reflect on the standard of medical care.   Gurtaj Ruz, [place]

## 2018-11-01 ENCOUNTER — Ambulatory Visit: Payer: 59 | Admitting: Psychiatry

## 2018-11-19 ENCOUNTER — Telehealth: Payer: Self-pay | Admitting: Obstetrics and Gynecology

## 2018-11-19 NOTE — Telephone Encounter (Signed)
Patient was returning call in regard to her daughter.   Encounter closed.

## 2018-11-19 NOTE — Telephone Encounter (Signed)
Patient left a message on the answering machine stating she was returning  a cal to [place].

## 2018-12-10 ENCOUNTER — Ambulatory Visit: Payer: 59 | Admitting: Psychiatry

## 2018-12-10 ENCOUNTER — Other Ambulatory Visit: Payer: Self-pay

## 2018-12-10 ENCOUNTER — Encounter: Payer: Self-pay | Admitting: Psychiatry

## 2018-12-10 DIAGNOSIS — F411 Generalized anxiety disorder: Secondary | ICD-10-CM

## 2018-12-10 NOTE — Progress Notes (Signed)
      [facility] Counselor/Therapist Progress Note  Patient ID: Jenna Obrien, MRN: [id number],    Date: 12/10/2018  Time Spent: 50 minutes   Treatment Type: Individual Therapy  Reported Symptoms: anxious  Mental Status Exam:  Appearance:   Casual and Well Groomed     Behavior:  Appropriate  Motor:  Normal  Speech/Language:   Clear and Coherent  Affect:  Appropriate  Mood:  anxious  Thought process:  normal  Thought content:    WNL  Sensory/Perceptual disturbances:    WNL  Orientation:  oriented to person, place, time/date and situation  Attention:  Good  Concentration:  Good  Memory:  WNL  Fund of knowledge:   Good  Insight:    Good  Judgment:   Good  Impulse Control:  Good   Risk Assessment: Danger to Self:  No Self-injurious Behavior: No Danger to Others: No Duty to Warn:no Physical Aggression / Violence:No  Access to Firearms a concern: No  Gang Involvement:No   Subjective: I met with the client face-to-face.  We both had facemasks. The client is upset with her ex-husband.  She recently found out that he had been diagnosed with emphysema but did not tell her.  "It made me very angry." The client's youngest daughter who was adopted is 56.  She describes her as "super stubborn."  When her daughter last visited with her dad, he let her use his phone to play games.  While she was playing a game a text came through.  It was from a woman and it seemed to be more girlfriend oriented.  This surprised her daughter because her dad had told her he was not interested in anyone.  Her daughter then went through all of his texts related to this woman.  There were apparently many texts that were sexual in nature.  Her daughter has screenshot them and save them in her snap chat.  Her daughter told her and was clearly upset by what she had discovered.  She told her mother in no uncertain terms to not reveal this to her father. The client is uncomfortable with not telling her  ex-husband.  I agreed with her that it was a violation of his privacy and inappropriate behavior by their daughter.  I advised the client to be direct with her daughter and tell her that she will tell her dad but first give her the opportunity to tell him herself.  The client feels that she can hold her ground on this.  Interventions: Assertiveness/Communication, Motivational Interviewing, Solution-Oriented/Positive Psychology and Insight-Oriented  Diagnosis:   ICD-10-CM   1. Generalized anxiety disorder F41.1     Plan: Boundaries, assertiveness.  This record has been created using [employer].  Chart creation errors have been sought, but Patrcia Schnepp not always have been located and corrected. Such creation errors do not reflect on the standard of medical care.  Basem Yannuzzi, [facility]

## 2019-01-01 ENCOUNTER — Ambulatory Visit: Payer: Managed Care, Other (non HMO) | Admitting: Obstetrics and Gynecology

## 2019-01-09 ENCOUNTER — Ambulatory Visit: Payer: 59 | Admitting: Psychiatry

## 2019-02-18 ENCOUNTER — Ambulatory Visit: Payer: Managed Care, Other (non HMO) | Admitting: Obstetrics and Gynecology

## 2019-04-14 ENCOUNTER — Other Ambulatory Visit: Payer: Self-pay | Admitting: Internal Medicine

## 2019-04-14 DIAGNOSIS — Z1231 Encounter for screening mammogram for malignant neoplasm of breast: Secondary | ICD-10-CM

## 2019-05-28 ENCOUNTER — Ambulatory Visit: Payer: Managed Care, Other (non HMO)

## 2019-06-23 ENCOUNTER — Ambulatory Visit (INDEPENDENT_AMBULATORY_CARE_PROVIDER_SITE_OTHER): Payer: 59 | Admitting: Psychiatry

## 2019-06-23 ENCOUNTER — Encounter: Payer: Self-pay | Admitting: Psychiatry

## 2019-06-23 DIAGNOSIS — Z8659 Personal history of other mental and behavioral disorders: Secondary | ICD-10-CM

## 2019-06-23 DIAGNOSIS — F411 Generalized anxiety disorder: Secondary | ICD-10-CM | POA: Diagnosis not present

## 2019-06-23 DIAGNOSIS — F3342 Major depressive disorder, recurrent, in full remission: Secondary | ICD-10-CM

## 2019-06-23 MED ORDER — VENLAFAXINE HCL ER 150 MG PO CP24: 300.0000 mg | ORAL_CAPSULE | Freq: Every day | ORAL | 3 refills | Status: DC

## 2019-06-23 NOTE — Progress Notes (Signed)
Jenna Obrien [id number] 1956/02/02 63 y.o.  Subjective:   Patient ID:  Jenna Obrien is a 63 y.o. (DOB Jun 18, 1956) female.  Chief Complaint:  Chief Complaint  Patient presents with  . Follow-up    Medication Management  . Anxiety    Medication Management    Anxiety Patient reports no confusion, decreased concentration, nervous/anxious behavior or suicidal ideas.     Jenna Obrien presents today for follow-up of anxiety and depression.    Last seen December 2019.  She had been doing fairly well and no meds were changed.  Been healthy overall.  Family healthy.  D in [employer] school.  Good she's been able to go to class.  Pleased about that.    Light orange venlafaxine XR did not help but the brownish red color does well.  Is a particular generic Aurobindo.  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    Doing largely OK.  Occ moments of emotional distress.  Most of the time mood and anxiety are under control.. Patient reports stable mood and denies depressed or irritable moods.  Patient denies any recent difficulty with anxiety.  Patient denies difficulty with sleep initiation or maintenance. Denies appetite disturbance.  Patient reports that energy and motivation have been good.  Patient denies any difficulty with concentration.  Patient denies any suicidal ideation.  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  Psychiatric/Behavioral: Negative for agitation, behavioral problems, confusion, decreased concentration, dysphoric mood, hallucinations, self-injury, sleep disturbance and suicidal ideas. The patient is not nervous/anxious and is not hyperactive.     Medications: I have reviewed the patient's current  medications.  Current Outpatient Medications  Medication Sig Dispense Refill  . Cholecalciferol (VITAMIN D3) 125 MCG (5000 UT) TABS Take 5,000 Units by mouth daily.    Marland Kitchen NASACORT ALLERGY 24HR 55 MCG/ACT AERO nasal inhaler Place 1 spray into both nostrils daily.  6  . pantoprazole (PROTONIX) 40 MG tablet     . PROLIA 60 MG/ML SOLN injection     . venlafaxine XR (EFFEXOR XR) 150 MG 24 hr capsule Take 2 capsules (300 mg total) by mouth daily. Takes 2 tablets daily 180 capsule 3   No current facility-administered medications for this visit.    Medication Side Effects: None  Allergies: No Known Allergies  Past Medical History:  Diagnosis Date  . Abnormal Pap smear of cervix    in 20's  . Anxiety   . Depression   . DES exposure in utero   . Dyspareunia   . Family history of colon cancer   . Family history of prostate cancer   . Family history of uterine cancer   . Fibroid   . Heart murmur   . Osteopenia     Family History  Problem Relation Age of Onset  . Diabetes Mother   . Hypertension Mother   . Thyroid disease Mother   . AAA (abdominal aortic aneurysm) Mother   . Lung cancer Father 7  . Prostate cancer Father        dx in his 84s  . Colon cancer Paternal Aunt        dx in her 27s  . Colon cancer Paternal Uncle        colon cancer vs colon polyps  .  Colon cancer Paternal Grandfather        dx over 31s  . Head & neck cancer Maternal Uncle        oral cancer  . Heart attack Maternal Grandmother   . Heart attack Maternal Grandfather   . Lung cancer Paternal Uncle        dx in his 15s-50s  . Colon cancer Cousin        paternal cousin dx in her 3s-40  . Uterine cancer Cousin        paternal cousin    Social History   Socioeconomic History  . Marital status: Married    Spouse name: Not on file  . Number of children: Not on file  . Years of education: Not on file  . Highest education level: Not on file  Occupational History  . Not on file  Tobacco Use  .  Smoking status: Never Smoker  . Smokeless tobacco: Never Used  Substance and Sexual Activity  . Alcohol use: Yes    Comment: 2-3 glasses of wine per night  . Drug use: No  . Sexual activity: Not on file  Other Topics Concern  . Not on file  Social History Narrative  . Not on file   Social Determinants of Health   Financial Resource Strain:   . Difficulty of Paying Living Expenses: Not on file  Food Insecurity:   . Worried About Charity fundraiser in the Last Year: Not on file  . Ran Out of Food in the Last Year: Not on file  Transportation Needs:   . Lack of Transportation (Medical): Not on file  . Lack of Transportation (Non-Medical): Not on file  Physical Activity:   . Days of Exercise per Week: Not on file  . Minutes of Exercise per Session: Not on file  Stress:   . Feeling of Stress : Not on file  Social Connections:   . Frequency of Communication with Friends and Family: Not on file  . Frequency of Social Gatherings with Friends and Family: Not on file  . Attends Religious Services: Not on file  . Active Member of Clubs or Organizations: Not on file  . Attends Archivist Meetings: Not on file  . Marital Status: Not on file  Intimate Partner Violence:   . Fear of Current or Ex-Partner: Not on file  . Emotionally Abused: Not on file  . Physically Abused: Not on file  . Sexually Abused: Not on file    Past Medical History, Surgical history, Social history, and Family history were reviewed and updated as appropriate.   Please see review of systems for further details on the patient's review from today.   Objective:   Physical Exam:  LMP 08/03/1998   Physical Exam Neurological:     Mental Status: She is alert and oriented to person, place, and time.     Cranial Nerves: No dysarthria.  Psychiatric:        Attention and Perception: Attention and perception normal.        Mood and Affect: Mood normal.        Speech: Speech normal.        Behavior:  Behavior is cooperative.        Thought Content: Thought content normal. Thought content is not paranoid or delusional. Thought content does not include homicidal or suicidal ideation. Thought content does not include homicidal or suicidal plan.        Cognition and Memory: Cognition and  memory normal.        Judgment: Judgment normal.     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  Overall been doing well in the past year.  Reports anxiety and depressive symptoms are under control. Past history of questionable alcohol abuse but she denies its problem now. Pleased with current generic of venlafaxine.  Had poor response from the venlafaxine XR that is an orange color but does well with this particular generic and with the brand.   Supportive therapy on break-up of her marriage and dealing with the children.  Fairly recent divorce.  She seems to be handling this pretty well  Eating disorder symptoms are under control by her report  She is satisfied with the Effexor at controlling symptoms of depression and anxiety.  We discussed withdrawal symptoms related to Effexor because of its short half-life.  No med changes today  Because of stability will wait 1 year before next appointment  Lynder Parents, MD, DFAPA    Please see After Visit Summary for patient specific instructions.  No future appointments.  No orders of the defined types were placed in this  encounter.     -------------------------------

## 2019-07-09 ENCOUNTER — Other Ambulatory Visit: Payer: Self-pay | Admitting: Psychiatry

## 2019-07-10 NOTE — Telephone Encounter (Signed)
Submitted 12/21

## 2019-08-13 ENCOUNTER — Other Ambulatory Visit: Payer: Self-pay

## 2019-08-13 ENCOUNTER — Ambulatory Visit
Admission: RE | Admit: 2019-08-13 | Discharge: 2019-08-13 | Disposition: A | Discharge status: Home / Self Care | Payer: Managed Care, Other (non HMO) | Source: Ambulatory Visit | Attending: Internal Medicine | Admitting: Internal Medicine

## 2019-08-13 DIAGNOSIS — Z1231 Encounter for screening mammogram for malignant neoplasm of breast: Secondary | ICD-10-CM

## 2019-10-16 ENCOUNTER — Other Ambulatory Visit: Payer: Self-pay | Admitting: Psychiatry

## 2020-01-23 ENCOUNTER — Encounter: Payer: Self-pay | Admitting: Psychiatry

## 2020-01-23 ENCOUNTER — Other Ambulatory Visit: Payer: Self-pay

## 2020-01-23 ENCOUNTER — Ambulatory Visit (INDEPENDENT_AMBULATORY_CARE_PROVIDER_SITE_OTHER): Payer: 59 | Admitting: Psychiatry

## 2020-01-23 DIAGNOSIS — F411 Generalized anxiety disorder: Secondary | ICD-10-CM

## 2020-01-23 NOTE — Progress Notes (Signed)
      [facility] Counselor/Therapist Progress Note  Patient ID: Jenna Obrien, MRN: [id number],    Date: 01/23/2020  Time Spent: 50 minutes   Treatment Type: Individual Therapy  Reported Symptoms: anxiety  Mental Status Exam:  Appearance:   Casual     Behavior:  Appropriate  Motor:  Normal  Speech/Language:   Clear and Coherent  Affect:  Appropriate  Mood:  anxious  Thought process:  normal  Thought content:    WNL  Sensory/Perceptual disturbances:    WNL  Orientation:  oriented to person, place, time/date and situation  Attention:  Good  Concentration:  Good  Memory:  WNL  Fund of knowledge:   Good  Insight:    Good  Judgment:   Good  Impulse Control:  Good   Risk Assessment: Danger to Self:  No Self-injurious Behavior: No Danger to Others: No Duty to Warn:no Physical Aggression / Violence:No  Access to Firearms a concern: No  Gang Involvement:No   Subjective: The client reports that her ex-husband ended up in the hospital with COVID-19.  He has multiple health issues related to his ongoing cancer, emphysema and general autoimmune disorder.  He was able to recover and is now at home.  The client is anxious because if he suddenly dies he has primary custody of their oldest adopted daughter.  She has the primary custody of their youngest adopted daughter.  The relationship between the client and her oldest daughter is fractured and combative.  She is wondering what his plans are for his oldest daughter who is 32 in case he dies.?  She is anxious that the oldest daughter will want to come and live with her which is untenable.  I discussed with the client that she Aleksis Jiggetts want to write her ex-husband a letter addressing issues of his will and plans for their oldest daughter.  We discussed that it would be fair for the client to set a boundary that the oldest daughter cannot live with her since she has been so combative and disrespectful to her. The client also discussed her  youngest daughter who treats her disrespectfully and acts with impunity.  We talked about setting boundaries and then making sure she could  enforce them.  This would include things such as being disrespectful and then losing access to her phone.  Ultimately she Ammon Muscatello have to live with her dad.  The client was relieved that the boundaries she wanted to set were not heinous.  Interventions: Assertiveness/Communication, Motivational Interviewing, Solution-Oriented/Positive Psychology and Insight-Oriented  Diagnosis:   ICD-10-CM   1. Generalized anxiety disorder  F41.1     Plan: Boundaries, assertiveness, letter to ex-husband.  Anishka Bushard, [facility]

## 2020-06-09 ENCOUNTER — Other Ambulatory Visit: Payer: Self-pay | Admitting: Internal Medicine

## 2020-06-09 DIAGNOSIS — Z1231 Encounter for screening mammogram for malignant neoplasm of breast: Secondary | ICD-10-CM

## 2020-07-20 ENCOUNTER — Other Ambulatory Visit: Payer: Self-pay | Admitting: Psychiatry

## 2020-07-20 NOTE — Telephone Encounter (Signed)
Call to RS

## 2020-08-12 ENCOUNTER — Inpatient Hospital Stay: Admission: RE | Admit: 2020-08-12 | Payer: Managed Care, Other (non HMO) | Source: Ambulatory Visit

## 2020-08-13 ENCOUNTER — Ambulatory Visit: Payer: Managed Care, Other (non HMO)

## 2020-09-23 ENCOUNTER — Telehealth: Payer: Self-pay | Admitting: Psychiatry

## 2020-09-23 NOTE — Telephone Encounter (Signed)
Pt has apt 3/29 w/ CC

## 2020-09-23 NOTE — Telephone Encounter (Signed)
Please schedule appt

## 2020-09-28 ENCOUNTER — Ambulatory Visit (INDEPENDENT_AMBULATORY_CARE_PROVIDER_SITE_OTHER): Payer: 59 | Admitting: Psychiatry

## 2020-09-28 ENCOUNTER — Other Ambulatory Visit: Payer: Self-pay

## 2020-09-28 ENCOUNTER — Encounter: Payer: Self-pay | Admitting: Psychiatry

## 2020-09-28 DIAGNOSIS — F3342 Major depressive disorder, recurrent, in full remission: Secondary | ICD-10-CM | POA: Diagnosis not present

## 2020-09-28 DIAGNOSIS — Z8659 Personal history of other mental and behavioral disorders: Secondary | ICD-10-CM

## 2020-09-28 DIAGNOSIS — F411 Generalized anxiety disorder: Secondary | ICD-10-CM

## 2020-09-28 MED ORDER — VENLAFAXINE HCL ER 150 MG PO CP24: 300.0000 mg | ORAL_CAPSULE | Freq: Every day | ORAL | 3 refills | Status: DC

## 2020-09-28 NOTE — Progress Notes (Signed)
Jenna Obrien [id number] 1956-02-15 65 y.o.  Subjective:   Patient ID:  Jenna Obrien is a 66 y.o. (DOB 11/22/1955) female.  Chief Complaint:  Chief Complaint  Patient presents with  . Follow-up    Anxiety and depression    Anxiety Patient reports no confusion, decreased concentration, nervous/anxious behavior or suicidal ideas.     Jenna Obrien presents today for follow-up of anxiety and depression.    seen December 2019.  She had been doing fairly well and no meds were changed.  06/2019 appt noted without med changes: Been healthy overall.  Family healthy.  D in [employer] school.  Good she's been able to go to class.  Pleased about that.   Light orange venlafaxine XR did not help but the brownish red color does well.  Is a particular generic Aurobindo. 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    09/28/2020 appointment with the following noted: Still able to get desired generic of venlafaxine XR and doing OK.  Still works from home permanently.  Has it's advantages.  No complaints.   Enjoys Jenna Obrien. 65 yo D driving now.  Stress with her lack of motivation for school.   No SE.   Doing largely OK.  Occ moments of emotional distress.  Most of the time mood and anxiety are under control.. Patient reports stable mood and denies depressed or irritable moods.  Patient denies any recent difficulty with anxiety.  Patient denies difficulty with sleep initiation or maintenance. Denies appetite disturbance.  Patient reports that energy and motivation have been good.  Patient denies any difficulty with concentration.  Patient denies any suicidal ideation.  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  Psychiatric/Behavioral:  Negative for agitation, behavioral problems, confusion, decreased concentration, dysphoric mood, hallucinations, self-injury, sleep disturbance and suicidal ideas. The patient is not nervous/anxious and is not hyperactive.     Medications: I have reviewed the patient's current medications.  Current Outpatient Medications  Medication Sig Dispense Refill  . Cholecalciferol (VITAMIN D3) 125 MCG (5000 UT) TABS Take 5,000 Units by mouth daily.    Marland Kitchen NASACORT ALLERGY 24HR 55 MCG/ACT AERO nasal inhaler Place 1 spray into both nostrils daily.  6  . pantoprazole (PROTONIX) 40 MG tablet     . PROLIA 60 MG/ML SOLN injection     . venlafaxine XR (EFFEXOR-XR) 150 MG 24 hr capsule Take 2 capsules (300 mg total) by mouth daily. 180 capsule 3   No current facility-administered medications for this visit.    Medication Side Effects: None  Allergies: No Known Allergies  Past Medical History:  Diagnosis Date  . Abnormal Pap smear of cervix    in 20's  . Anxiety   . Depression   . DES exposure in utero   . Dyspareunia   . Family history of colon cancer   . Family history of prostate cancer   . Family history of uterine cancer   . Fibroid   . Heart murmur   . Osteopenia     Family History  Problem Relation Age of Onset  . Diabetes Mother   . Hypertension Mother   . Thyroid disease Mother   . AAA (abdominal aortic aneurysm) Mother   . Lung cancer Father 61  . Prostate  cancer Father        dx in his 53s  . Colon cancer Paternal Aunt        dx in her 19s  . Colon cancer Paternal Uncle        colon cancer vs colon polyps  . Colon cancer Paternal Grandfather        dx over 42s  . Head & neck cancer Maternal Uncle        oral cancer  . Heart attack Maternal Grandmother   . Heart attack Maternal Grandfather   . Lung cancer Paternal Uncle        dx in his 61s-50s  . Colon cancer Cousin        paternal cousin dx in her 18s-40  . Uterine cancer Cousin        paternal cousin    Social  History   Socioeconomic History  . Marital status: Married    Spouse name: Not on file  . Number of children: Not on file  . Years of education: Not on file  . Highest education level: Not on file  Occupational History  . Not on file  Tobacco Use  . Smoking status: Never Smoker  . Smokeless tobacco: Never Used  Substance and Sexual Activity  . Alcohol use: Yes    Comment: 2-3 glasses of wine per night  . Drug use: No  . Sexual activity: Not on file  Other Topics Concern  . Not on file  Social History Narrative  . Not on file   Social Determinants of Health   Financial Resource Strain: Not on file  Food Insecurity: Not on file  Transportation Needs: Not on file  Physical Activity: Not on file  Stress: Not on file  Social Connections: Not on file  Intimate Partner Violence: Not on file    Past Medical History, Surgical history, Social history, and Family history were reviewed and updated as appropriate.   Please see review of systems for further details on the patient's review from today.   Objective:   Physical Exam:  LMP 08/03/1998   Physical Exam Neurological:     Mental Status: She is alert and oriented to person, place, and time.     Cranial Nerves: No dysarthria.  Psychiatric:        Attention and Perception: Attention and perception normal.        Mood and Affect: Mood normal. Mood is not anxious or depressed.        Speech: Speech normal.        Behavior: Behavior is cooperative.        Thought Content: Thought content normal. Thought content is not paranoid or delusional. Thought content does not include homicidal or suicidal ideation. Thought content does not include homicidal or suicidal plan.        Cognition and Memory: Cognition and memory normal.        Judgment: Judgment normal.     Comments: Insight intact     Lab Review:  No results found for: NA, K, CL, CO2, GLUCOSE, BUN, CREATININE, CALCIUM, PROT, ALBUMIN, AST, ALT, ALKPHOS, BILITOT,  GFRNONAA, GFRAA  No results found for: WBC, RBC, HGB, HCT, PLT, MCV, MCH, MCHC, RDW, LYMPHSABS, MONOABS, EOSABS, BASOSABS  No results found for: POCLITH, LITHIUM   No results found for: PHENYTOIN, PHENOBARB, VALPROATE, CBMZ   .res Assessment: Plan:    Generalized anxiety disorder - Plan: venlafaxine XR (EFFEXOR-XR) 150 MG 24 hr capsule  Recurrent major depression in  complete remission ([place]) - Plan: venlafaxine XR (EFFEXOR-XR) 150 MG 24 hr capsule  History of eating disorder   Overall been doing well in the past year.  Reports anxiety and depressive symptoms are under control. Past history of questionable alcohol abuse but she denies its problem now. Pleased with current generic of venlafaxine.  Had poor response from the venlafaxine XR that is an orange color but does well with this particular generic and with the brand.  Supportive therapy on break-up of her marriage and dealing with the children.    She seems to be handling this pretty well  Eating disorder symptoms are under control by her report  She is satisfied with the Effexor at controlling symptoms of depression and anxiety.  We discussed withdrawal symptoms related to Effexor because of its short half-life.  No med changes today  Because of stability will wait 1 year before next appointment  Lynder Parents, MD, DFAPA    Please see After Visit Summary for patient specific instructions.  Future Appointments  Date Time Provider [place]  09/29/2020 11:40 AM [facility] 3 [facility]  10/20/2020  3:30 PM Amundson Raliegh Ip, MD [facility] None    No orders of the defined types were placed in this encounter.     -------------------------------

## 2020-09-29 ENCOUNTER — Ambulatory Visit
Admission: RE | Admit: 2020-09-29 | Discharge: 2020-09-29 | Disposition: A | Discharge status: Home / Self Care | Payer: Managed Care, Other (non HMO) | Source: Ambulatory Visit | Attending: Internal Medicine | Admitting: Internal Medicine

## 2020-09-29 DIAGNOSIS — Z1231 Encounter for screening mammogram for malignant neoplasm of breast: Secondary | ICD-10-CM

## 2020-10-19 NOTE — Progress Notes (Signed)
65 y.o. G0P0 Married Caucasian female here for annual exam.   Patient has "bump" on right breast--?pimple.   Effexor is working well.   Working from home.   PCP: Marton Redwood, Md   Patient's last menstrual period was 08/03/1998.           Sexually active: no  The current method of family planning is status post hysterectomy.   Exercising: no. Smoker: no  Health Maintenance: Pap: 12-20-17 Neg:Neg HR HPV, 12-17-15 Neg:Neg HR HPV,  History of abnormal Pap: Yes in her 20s.  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  Took Fosamax & had reflux symptoms. Dr.Shaw monitoring. On Prolia.   TDaP:  PCP HIV:no Hep C:no Screening Labs:  PCP.   reports that she has never smoked. She has never used smokeless tobacco. She reports current alcohol use of about 14.0 standard drinks of alcohol per week. She reports that she does not use drugs.  Past Medical History:  Diagnosis Date  . Abnormal Pap smear of cervix    in 20's  . Anxiety   . Depression   . DES exposure in utero   . Dyspareunia   . Family history of colon cancer   . Family history of prostate cancer   . Family history of uterine cancer   . Fibroid   . Heart murmur   . Osteopenia   . PVC's (premature ventricular contractions)     Past Surgical History:  Procedure Laterality Date  . ABDOMINAL HYSTERECTOMY  08/1998   TAH--Due to fibroids--Dr. Ubaldo Glassing  . AUGMENTATION MAMMAPLASTY    . BREAST EXCISIONAL BIOPSY      Current Outpatient Medications  Medication Sig Dispense Refill  . Cholecalciferol (VITAMIN D3) 125 MCG (5000 UT) TABS Take 5,000 Units by mouth daily.    . clobetasol ointment (TEMOVATE) 0.05 % Apply topically.    Marland Kitchen NASACORT ALLERGY 24HR 55 MCG/ACT AERO nasal inhaler Place 1 spray into both nostrils daily.  6  . pantoprazole (PROTONIX) 20 MG tablet TAKE 2 TABLETS BY MOUTH DAILY AND 1 TABLET BY MOUTH WITH DINNER    .  PROLIA 60 MG/ML SOLN injection     . venlafaxine XR (EFFEXOR-XR) 150 MG 24 hr capsule Take 2 capsules (300 mg total) by mouth daily. 180 capsule 3   No current facility-administered medications for this visit.    Family History  Problem Relation Age of Onset  . Diabetes Mother   . Hypertension Mother   . Thyroid disease Mother   . AAA (abdominal aortic aneurysm) Mother   . Lung cancer Father 74  . Prostate cancer Father        dx in his 42s  . Colon cancer Paternal Aunt        dx in her 60s  . Colon cancer Paternal Uncle        colon cancer vs colon polyps  . Colon cancer Paternal Grandfather        dx over 67s  . Head & neck cancer Maternal Uncle        oral cancer  . Heart attack Maternal Grandmother   . Heart attack Maternal Grandfather   . Lung cancer Paternal Uncle        dx in his 55s-50s  . Colon cancer Cousin        paternal cousin dx in her 48s-40  . Uterine cancer Cousin        paternal cousin  Review of Systems  All other systems reviewed and are negative.   Exam:   BP 122/64   Pulse 99   Ht 5\' 3"  (1.6 m)   Wt 124 lb (56.2 kg)   LMP 08/03/1998   SpO2 100%   BMI 21.97 kg/m     General appearance: alert, cooperative and appears stated age Head: normocephalic, without obvious abnormality, atraumatic Neck: no adenopathy, supple, symmetrical, trachea midline and thyroid normal to inspection and palpation Lungs: clear to auscultation bilaterally Breasts: bilateral implants, 3-4 mm sebaceous cyst of right lateral breast, no masses or tenderness, No nipple retraction or dimpling, No nipple discharge or bleeding, No axillary adenopathy Heart: regular rate and rhythm Abdomen: soft, non-tender; no masses, no organomegaly Extremities: extremities normal, atraumatic, no cyanosis or edema Skin: skin color, texture, turgor normal. No rashes or lesions Lymph nodes: cervical, supraclavicular, and axillary nodes normal. Neurologic: grossly normal  Pelvic: External  genitalia:  no lesions              No abnormal inguinal nodes palpated.              Urethra:  normal appearing urethra with no masses, tenderness or lesions              Bartholins and Skenes: normal                 Vagina: normal appearing vagina with normal color and discharge, no lesions              Cervix: absent              Pap taken: Yes.   Bimanual Exam:  Uterus:  absent              Adnexa: no mass, fullness, tenderness              Rectal exam: Yes.  .  Confirms.              Anus:  normal sphincter tone, rectal mass - 4 mm, smooth, at 12:00.  Chaperone was present for exam.  Assessment:   Well woman visit with normal exam. Status post TAH for fibroids. Ovaries remain. Status post bilateral breast implants. Vaginal atrophy.  Osteoporosis. Treatment with Prolia.  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  Hx abnormal pap.  Remote hx cryotherapy.  FH colon, lung, and uterine cancer.  Negative genetic testing.  Psoriasis.  Rectal mass versus cyst of rectovaginal septum.    Plan: Mammogram screening discussed. Self breast awareness reviewed. Pap and HR HPV as above. Guidelines for Calcium, Vitamin D, regular exercise program including cardiovascular and weight bearing exercise. Referral to gastroenterology.    Follow up annually and prn.

## 2020-10-20 ENCOUNTER — Other Ambulatory Visit (HOSPITAL_COMMUNITY)
Admission: RE | Admit: 2020-10-20 | Discharge: 2020-10-20 | Disposition: A | Discharge status: Home / Self Care | Payer: Managed Care, Other (non HMO) | Source: Ambulatory Visit | Attending: Obstetrics and Gynecology | Admitting: Obstetrics and Gynecology

## 2020-10-20 ENCOUNTER — Other Ambulatory Visit: Payer: Self-pay

## 2020-10-20 ENCOUNTER — Encounter: Payer: Self-pay | Admitting: Obstetrics and Gynecology

## 2020-10-20 ENCOUNTER — Ambulatory Visit (INDEPENDENT_AMBULATORY_CARE_PROVIDER_SITE_OTHER): Payer: Managed Care, Other (non HMO) | Admitting: Obstetrics and Gynecology

## 2020-10-20 VITALS — BP 122/64 | HR 99 | Ht 63.0 in | Wt 124.0 lb

## 2020-10-20 DIAGNOSIS — K6289 Other specified diseases of anus and rectum: Secondary | ICD-10-CM

## 2020-10-20 DIAGNOSIS — Z9189 Other specified personal risk factors, not elsewhere classified: Secondary | ICD-10-CM | POA: Diagnosis not present

## 2020-10-20 DIAGNOSIS — Z01419 Encounter for gynecological examination (general) (routine) without abnormal findings: Secondary | ICD-10-CM | POA: Diagnosis not present

## 2020-10-20 NOTE — Patient Instructions (Signed)

## 2020-10-21 LAB — CYTOLOGY - PAP
Comment: NEGATIVE
Diagnosis: NEGATIVE
High risk HPV: NEGATIVE

## 2020-10-22 ENCOUNTER — Telehealth: Payer: Self-pay | Admitting: *Deleted

## 2020-10-22 NOTE — Telephone Encounter (Signed)
Patient said you placed at referral at [facility] with Dr.Gesser for rectal mass, [place] called patient and asked her does she still see Dr.Medoff (apparently everyone thought he retired, but he is still practicing for established patients only) patient would prefer to see Dr.Medoff so she asked I sent the office notes. I faxed the notes to [phone] provided by his office. Just FYI.

## 2020-10-24 NOTE — Telephone Encounter (Signed)
Great for her to see Dr. Earlean Shawl.   Please cancel the referral to GI at [facility].

## 2020-10-26 NOTE — Telephone Encounter (Signed)
Patient aware.

## 2021-03-29 ENCOUNTER — Ambulatory Visit: Payer: Managed Care, Other (non HMO) | Admitting: Podiatry

## 2021-03-29 ENCOUNTER — Other Ambulatory Visit: Payer: Self-pay

## 2021-03-29 ENCOUNTER — Encounter: Payer: Self-pay | Admitting: Podiatry

## 2021-03-29 DIAGNOSIS — L603 Nail dystrophy: Secondary | ICD-10-CM

## 2021-03-29 NOTE — Progress Notes (Signed)
Subjective:  Patient ID: Jenna Obrien, female    DOB: 1956/06/12,  MRN: [id number] HPI Chief Complaint  Patient presents with   Nail Problem    Hallux right - medial border - tenderness and a ridge across the whole nail, trimmed back once but came back   New Patient (Initial Visit)    65 y.o. female presents with the above complaint.   ROS: Denies fever chills nausea vomiting muscle aches pains calf pain back pain chest pain shortness of breath.  Past Medical History:  Diagnosis Date   Abnormal Pap smear of cervix    in 20's   Anxiety    Depression    DES exposure in utero    Dyspareunia    Family history of colon cancer    Family history of prostate cancer    Family history of uterine cancer    Fibroid    Heart murmur    Osteopenia    PVC's (premature ventricular contractions)    Past Surgical History:  Procedure Laterality Date   ABDOMINAL HYSTERECTOMY  08/1998   TAH--Due to fibroids--Dr. Ubaldo Glassing   AUGMENTATION MAMMAPLASTY     BREAST EXCISIONAL BIOPSY      Current Outpatient Medications:    calcipotriene (DOVONOX) 0.005 % ointment, Apply topically 2 (two) times daily., Disp: , Rfl:    Cholecalciferol (VITAMIN D3) 125 MCG (5000 UT) TABS, Take 5,000 Units by mouth daily., Disp: , Rfl:    clobetasol ointment (TEMOVATE) 0.05 %, Apply topically., Disp: , Rfl:    NASACORT ALLERGY 24HR 55 MCG/ACT AERO nasal inhaler, Place 1 spray into both nostrils daily., Disp: , Rfl: 6   pantoprazole (PROTONIX) 20 MG tablet, TAKE 2 TABLETS BY MOUTH DAILY AND 1 TABLET BY MOUTH WITH DINNER, Disp: , Rfl:    PROLIA 60 MG/ML SOLN injection, , Disp: , Rfl:    venlafaxine XR (EFFEXOR-XR) 150 MG 24 hr capsule, Take 2 capsules (300 mg total) by mouth daily., Disp: 180 capsule, Rfl: 3  No Known Allergies Review of Systems Objective:  There were no vitals filed for this visit.  General: Well developed, nourished, in no acute distress, alert and oriented x3   Dermatological: Skin is warm, dry  and supple bilateral. Nails x 10 are well maintained; remaining integument appears unremarkable at this time. There are no open sores, no preulcerative lesions, no rash or signs of infection present.  Hallux nail plate right demonstrates a new nail growing beneath and ulnar nail has pushed out by about 50% of the nail plate.  I did debride a small lift off of the nail but no signs of infection.  This is most likely nail dystrophy secondary to hallux valgus deformity and elevated first metatarsal tarsal phalangeal joint.  Vascular: Dorsalis Pedis artery and Posterior Tibial artery pedal pulses are 2/4 bilateral with immedate capillary fill time. Pedal hair growth present. No varicosities and no lower extremity edema present bilateral.   Neruologic: Grossly intact via light touch bilateral. Vibratory intact via tuning fork bilateral. Protective threshold with Semmes Wienstein monofilament intact to all pedal sites bilateral. Patellar and Achilles deep tendon reflexes 2+ bilateral. No Babinski or clonus noted bilateral.   Musculoskeletal: No gross boney pedal deformities bilateral. No pain, crepitus, or limitation noted with foot and ankle range of motion bilateral. Muscular strength 5/5 in all groups tested bilateral.  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  Gait: Unassisted, Nonantalgic.    Radiographs:  None taken. Assessment & Plan:  Assessment: Nail dystrophy hallux right  Plan: Trim the portion of the nail discussed bunion surgery which would consist of a Lapidus procedure.     [facility] [employer] [place], [place]

## 2021-09-22 ENCOUNTER — Other Ambulatory Visit: Payer: Self-pay | Admitting: Psychiatry

## 2021-09-22 DIAGNOSIS — F411 Generalized anxiety disorder: Secondary | ICD-10-CM

## 2021-09-22 DIAGNOSIS — F3342 Major depressive disorder, recurrent, in full remission: Secondary | ICD-10-CM

## 2021-09-23 NOTE — Telephone Encounter (Signed)
Pt has an appt on 5/3 ?

## 2021-09-23 NOTE — Telephone Encounter (Signed)
Please schedule appt

## 2021-09-27 ENCOUNTER — Other Ambulatory Visit: Payer: Self-pay | Admitting: Internal Medicine

## 2021-09-27 DIAGNOSIS — Z1231 Encounter for screening mammogram for malignant neoplasm of breast: Secondary | ICD-10-CM

## 2021-10-04 ENCOUNTER — Ambulatory Visit: Payer: Managed Care, Other (non HMO)

## 2021-10-12 ENCOUNTER — Ambulatory Visit: Payer: Managed Care, Other (non HMO)

## 2021-10-18 ENCOUNTER — Ambulatory Visit
Admission: RE | Admit: 2021-10-18 | Discharge: 2021-10-18 | Disposition: A | Discharge status: Home / Self Care | Payer: Managed Care, Other (non HMO) | Source: Ambulatory Visit | Attending: Internal Medicine | Admitting: Internal Medicine

## 2021-10-18 DIAGNOSIS — Z1231 Encounter for screening mammogram for malignant neoplasm of breast: Secondary | ICD-10-CM

## 2021-10-26 ENCOUNTER — Ambulatory Visit: Payer: Managed Care, Other (non HMO) | Admitting: Obstetrics and Gynecology

## 2021-11-02 ENCOUNTER — Telehealth (INDEPENDENT_AMBULATORY_CARE_PROVIDER_SITE_OTHER): Payer: 59 | Admitting: Psychiatry

## 2021-11-02 ENCOUNTER — Encounter: Payer: Self-pay | Admitting: Psychiatry

## 2021-11-02 DIAGNOSIS — Z8659 Personal history of other mental and behavioral disorders: Secondary | ICD-10-CM | POA: Diagnosis not present

## 2021-11-02 DIAGNOSIS — F3342 Major depressive disorder, recurrent, in full remission: Secondary | ICD-10-CM | POA: Diagnosis not present

## 2021-11-02 DIAGNOSIS — F411 Generalized anxiety disorder: Secondary | ICD-10-CM

## 2021-11-02 MED ORDER — VENLAFAXINE HCL ER 150 MG PO CP24: 300.0000 mg | ORAL_CAPSULE | Freq: Every day | ORAL | 3 refills | Status: DC

## 2021-11-02 NOTE — Progress Notes (Signed)
Jenna Obrien ?[id number] ?Feb 27, 1956 ?66 y.o. ? ?Video Visit via My Chart ? ?I connected with pt by My Chart video and verified that I am speaking with the correct person using two identifiers. ?  ?I discussed the limitations, risks, security and privacy concerns of performing an evaluation and management service by My Chart  and the availability of in person appointments. I also discussed with the patient that there may be a patient responsible charge related to this service. The patient expressed understanding and agreed to proceed. ? ?I discussed the assessment and treatment plan with the patient. The patient was provided an opportunity to ask questions and all were answered. The patient agreed with the plan and demonstrated an understanding of the instructions. ?  ?The patient was advised to call back or seek an in-person evaluation if the symptoms worsen or if the condition fails to improve as anticipated. ? ?I provided 30 minutes of video time during this encounter.  The patient was located at home and the provider was located office. ?Session from 930-945 AM ? ?Subjective:  ? ?Patient ID:  Jenna Obrien is a 66 y.o. (DOB 06-23-1956) female. ? ?Chief Complaint:  ?Chief Complaint  ?Patient presents with  ? Follow-up  ? Generalized anxiety disorder  ? ? ?Anxiety ?Patient reports no confusion, decreased concentration, nervous/anxious behavior or suicidal ideas.  ? ? ?Jenna Obrien presents today for follow-up of anxiety and depression.   ? ?seen December 2019.  She had been doing fairly well and no meds were changed. ? ?06/2019 appt noted without med changes: ?Been healthy overall.  Family healthy.  D in [employer] school.  Good she's been able to go to class.  Pleased about that.   ?Light orange venlafaxine XR did not help but the brownish red color does well.  Is a particular generic Aurobindo. ?Work from home permanently  Now.   ? ?09/28/2020 appointment with the following noted: ?Still able to get desired  generic of venlafaxine XR and doing OK.  ?Still works from home permanently.  Has it's advantages.  No complaints.   ?Enjoys [employer]. 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  Stress with her lack of motivation for school.   ?No SE.  ?Plan continue Effexor XR 150 2 daily as only psych med ? ?11/02/21 appt noted. ?Doing well with Effexor XR 300 mg daily.  No SE. ?Covid early Feb.   ?Stopped alcohol completely and feels better.  Asks question about naltrexone and accamprosate. ?Asks about other antidepressants that affect serotonin and NE. ? ?Doing largely OK.  Occ moments of emotional distress.  Most of the time mood and anxiety are under control.. Patient reports stable mood and denies depressed or irritable moods.  Patient denies any recent difficulty with anxiety.  Patient denies difficulty with sleep initiation or maintenance. Denies appetite disturbance.  Patient reports that energy and motivation have been good.  Patient denies any difficulty with concentration.  Patient denies any suicidal ideation. ? ?Past Psychiatric Medication Trials: Brief sertraline, nefazodone, venlafaxine 300, buspirone, Abilify 5, Depakote 1000, ?Zolpidem ?Patient of this press practice since 1999 ? ?Review of Systems:  ?Review of Systems  ?Gastrointestinal:   ?     Heartburn ?  ?Neurological:  Negative for tremors.  ?Psychiatric/Behavioral:  Negative for agitation, behavioral problems, confusion, decreased concentration, dysphoric mood, hallucinations, self-injury, sleep disturbance and suicidal ideas. The patient is not nervous/anxious and is not hyperactive.   ? ?Medications: I have reviewed the patient's current medications. ? ?Current  Outpatient Medications  ?Medication Sig Dispense Refill  ? calcipotriene (DOVONOX) 0.005 % ointment Apply topically 2 (two) times daily.    ? Cholecalciferol (VITAMIN D3) 125 MCG (5000 UT) TABS Take 5,000 Units by mouth daily.    ? clobetasol ointment (TEMOVATE) 0.05 % Apply topically.    ? NASACORT ALLERGY 24HR  55 MCG/ACT AERO nasal inhaler Place 1 spray into both nostrils daily.  6  ? pantoprazole (PROTONIX) 20 MG tablet TAKE 2 TABLETS BY MOUTH DAILY AND 1 TABLET BY MOUTH WITH DINNER    ? PROLIA 60 MG/ML SOLN injection     ? venlafaxine XR (EFFEXOR-XR) 150 MG 24 hr capsule Take 2 capsules (300 mg total) by mouth daily with breakfast. 180 capsule 3  ? ?No current facility-administered medications for this visit.  ? ? ?Medication Side Effects: None ? ?Allergies: No Known Allergies ? ?Past Medical History:  ?Diagnosis Date  ? Abnormal Pap smear of cervix   ? in 20's  ? Anxiety   ? Depression   ? DES exposure in utero   ? Dyspareunia   ? Family history of colon cancer   ? Family history of prostate cancer   ? Family history of uterine cancer   ? Fibroid   ? Heart murmur   ? Osteopenia   ? PVC's (premature ventricular contractions)   ? ? ?Family History  ?Problem Relation Age of Onset  ? Diabetes Mother   ? Hypertension Mother   ? Thyroid disease Mother   ? AAA (abdominal aortic aneurysm) Mother   ? Lung cancer Father 41  ? Prostate cancer Father   ?     dx in his 71s  ? Colon cancer Paternal Aunt   ?     dx in her 11s  ? Colon cancer Paternal Uncle   ?     colon cancer vs colon polyps  ? Colon cancer Paternal Grandfather   ?     dx over 58s  ? Head & neck cancer Maternal Uncle   ?     oral cancer  ? Heart attack Maternal Grandmother   ? Heart attack Maternal Grandfather   ? Lung cancer Paternal Uncle   ?     dx in his 27s-50s  ? Colon cancer Cousin   ?     paternal cousin dx in her 32s-40  ? Uterine cancer Cousin   ?     paternal cousin  ? ? ?Social History  ? ?Socioeconomic History  ? Marital status: Married  ?  Spouse name: Not on file  ? Number of children: Not on file  ? Years of education: Not on file  ? Highest education level: Not on file  ?Occupational History  ? Not on file  ?Tobacco Use  ? Smoking status: Never  ? Smokeless tobacco: Never  ?Vaping Use  ? Vaping Use: Never used  ?Substance and Sexual Activity  ?  Alcohol use: Yes  ?  Alcohol/week: 14.0 standard drinks  ?  Types: 14 Glasses of wine per week  ?  Comment: 2-3 glasses of wine per night  ? Drug use: No  ? Sexual activity: Not Currently  ?  Partners: Male  ?  Birth control/protection: Surgical  ?Other Topics Concern  ? Not on file  ?Social History Narrative  ? Not on file  ? ?Social Determinants of Health  ? ?Financial Resource Strain: Not on file  ?Food Insecurity: Not on file  ?Transportation Needs: Not on file  ?Physical  Activity: Not on file  ?Stress: Not on file  ?Social Connections: Not on file  ?Intimate Partner Violence: Not on file  ? ? ?Past Medical History, Surgical history, Social history, and Family history were reviewed and updated as appropriate.  ? ?Please see review of systems for further details on the patient's review from today.  ? ?Objective:  ? ?Physical Exam:  ?LMP 08/03/1998  ? ?Physical Exam ?Neurological:  ?   Mental Status: She is alert and oriented to person, place, and time.  ?   Cranial Nerves: No dysarthria.  ?Psychiatric:     ?   Attention and Perception: Attention and perception normal.     ?   Mood and Affect: Mood normal. Mood is not anxious or depressed.     ?   Speech: Speech normal.     ?   Behavior: Behavior is cooperative.     ?   Thought Content: Thought content normal. Thought content is not paranoid or delusional. Thought content does not include homicidal or suicidal ideation. Thought content does not include suicidal plan.     ?   Cognition and Memory: Cognition and memory normal.     ?   Judgment: Judgment normal.  ?   Comments: Insight intact  ? ? ?Lab Review:  ?No results found for: NA, K, CL, CO2, GLUCOSE, BUN, CREATININE, CALCIUM, PROT, ALBUMIN, AST, ALT, ALKPHOS, BILITOT, GFRNONAA, GFRAA ? ?No results found for: WBC, RBC, HGB, HCT, PLT, MCV, MCH, MCHC, RDW, LYMPHSABS, MONOABS, EOSABS, BASOSABS ? ?No results found for: POCLITH, LITHIUM  ? ?No results found for: PHENYTOIN, PHENOBARB, VALPROATE, CBMZ   ? ?.res ?Assessment: Plan:   ? ?Generalized anxiety disorder - Plan: venlafaxine XR (EFFEXOR-XR) 150 MG 24 hr capsule ? ?Recurrent major depression in complete remission ([place]) - Plan: venlafaxine XR (EFFEXOR-XR) 150 MG 2

## 2021-12-12 NOTE — Progress Notes (Unsigned)
66 y.o. G0P0 Married Caucasian female here for annual exam.    PCP:     Patient's last menstrual period was 08/03/1998.           Sexually active: {yes no:314532}  The current method of family planning is {contraception:315051}.    Exercising: {yes no:314532}  {types:19826} Smoker:  no  Health Maintenance: Pap:  10-20-20 normal Neg HPV--12-24-17 Normal Neg HPV History of abnormal Pap:  yes-In her 20's MMG:  10-18-21 normal BiRADS 1 Cat.C Colonoscopy:  2020 normal 5 yr.follow up due to family hx BMD:   07-2020   Result  Osteoporosis TDaP:  PCP Gardasil:   no HIV: Hep C: Screening Labs:  Hb today: ***, Urine today: ***   reports that she has never smoked. She has never used smokeless tobacco. She reports current alcohol use of about 14.0 standard drinks of alcohol per week. She reports that she does not use drugs.  Past Medical History:  Diagnosis Date   Abnormal Pap smear of cervix    in 20's   Anxiety    Depression    DES exposure in utero    Dyspareunia    Family history of colon cancer    Family history of prostate cancer    Family history of uterine cancer    Fibroid    Heart murmur    Osteopenia    PVC's (premature ventricular contractions)     Past Surgical History:  Procedure Laterality Date   ABDOMINAL HYSTERECTOMY  08/1998   TAH--Due to fibroids--Dr. Ubaldo Glassing   AUGMENTATION MAMMAPLASTY     BREAST EXCISIONAL BIOPSY      Current Outpatient Medications  Medication Sig Dispense Refill   calcipotriene (DOVONOX) 0.005 % ointment Apply topically 2 (two) times daily.     Cholecalciferol (VITAMIN D3) 125 MCG (5000 UT) TABS Take 5,000 Units by mouth daily.     clobetasol ointment (TEMOVATE) 0.05 % Apply topically.     NASACORT ALLERGY 24HR 55 MCG/ACT AERO nasal inhaler Place 1 spray into both nostrils daily.  6   pantoprazole (PROTONIX) 20 MG tablet TAKE 2 TABLETS BY MOUTH DAILY AND 1 TABLET BY MOUTH WITH DINNER     PROLIA 60 MG/ML SOLN injection      venlafaxine XR  (EFFEXOR-XR) 150 MG 24 hr capsule Take 2 capsules (300 mg total) by mouth daily with breakfast. 180 capsule 3   No current facility-administered medications for this visit.    Family History  Problem Relation Age of Onset   Diabetes Mother    Hypertension Mother    Thyroid disease Mother    AAA (abdominal aortic aneurysm) Mother    Lung cancer Father 48   Prostate cancer Father        dx in his 53s   Colon cancer Paternal Aunt        dx in her 47s   Colon cancer Paternal Uncle        colon cancer vs colon polyps   Colon cancer Paternal Grandfather        dx over 58s   Head & neck cancer Maternal Uncle        oral cancer   Heart attack Maternal Grandmother    Heart attack Maternal Grandfather    Lung cancer Paternal Uncle        dx in his 73s-50s   Colon cancer Cousin        paternal cousin dx in her 42s-40   Uterine cancer Cousin  paternal cousin    Review of Systems  Exam:   LMP 08/03/1998     General appearance: alert, cooperative and appears stated age Head: normocephalic, without obvious abnormality, atraumatic Neck: no adenopathy, supple, symmetrical, trachea midline and thyroid normal to inspection and palpation Lungs: clear to auscultation bilaterally Breasts: normal appearance, no masses or tenderness, No nipple retraction or dimpling, No nipple discharge or bleeding, No axillary adenopathy Heart: regular rate and rhythm Abdomen: soft, non-tender; no masses, no organomegaly Extremities: extremities normal, atraumatic, no cyanosis or edema Skin: skin color, texture, turgor normal. No rashes or lesions Lymph nodes: cervical, supraclavicular, and axillary nodes normal. Neurologic: grossly normal  Pelvic: External genitalia:  no lesions              No abnormal inguinal nodes palpated.              Urethra:  normal appearing urethra with no masses, tenderness or lesions              Bartholins and Skenes: normal                 Vagina: normal appearing  vagina with normal color and discharge, no lesions              Cervix: no lesions              Pap taken: {yes no:314532} Bimanual Exam:  Uterus:  normal size, contour, position, consistency, mobility, non-tender              Adnexa: no mass, fullness, tenderness              Rectal exam: {yes no:314532}.  Confirms.              Anus:  normal sphincter tone, no lesions  Chaperone was present for exam:  ***  Assessment:   Well woman visit with gynecologic exam.   Plan: Mammogram screening discussed. Self breast awareness reviewed. Pap and HR HPV as above. Guidelines for Calcium, Vitamin D, regular exercise program including cardiovascular and weight bearing exercise.   Follow up annually and prn.   Additional counseling given.  {yes [id number]. _______ minutes face to face time of which over 50% was spent in counseling.    After visit summary provided.

## 2021-12-14 ENCOUNTER — Ambulatory Visit: Payer: Managed Care, Other (non HMO) | Admitting: Obstetrics and Gynecology

## 2021-12-23 ENCOUNTER — Encounter: Payer: Self-pay | Admitting: Obstetrics and Gynecology

## 2021-12-23 ENCOUNTER — Other Ambulatory Visit (HOSPITAL_COMMUNITY)
Admission: RE | Admit: 2021-12-23 | Discharge: 2021-12-23 | Disposition: A | Discharge status: Home / Self Care | Payer: Medicare Other | Source: Ambulatory Visit | Attending: Obstetrics and Gynecology | Admitting: Obstetrics and Gynecology

## 2021-12-23 ENCOUNTER — Ambulatory Visit (INDEPENDENT_AMBULATORY_CARE_PROVIDER_SITE_OTHER): Payer: Medicare Other | Admitting: Obstetrics and Gynecology

## 2021-12-23 VITALS — BP 106/60 | HR 76 | Ht 63.0 in | Wt 109.0 lb

## 2021-12-23 DIAGNOSIS — Z124 Encounter for screening for malignant neoplasm of cervix: Secondary | ICD-10-CM | POA: Insufficient documentation

## 2021-12-23 DIAGNOSIS — Z9189 Other specified personal risk factors, not elsewhere classified: Secondary | ICD-10-CM

## 2021-12-23 DIAGNOSIS — N952 Postmenopausal atrophic vaginitis: Secondary | ICD-10-CM

## 2021-12-23 DIAGNOSIS — Z1151 Encounter for screening for human papillomavirus (HPV): Secondary | ICD-10-CM | POA: Insufficient documentation

## 2021-12-23 DIAGNOSIS — Z01419 Encounter for gynecological examination (general) (routine) without abnormal findings: Secondary | ICD-10-CM | POA: Insufficient documentation

## 2021-12-23 DIAGNOSIS — K6289 Other specified diseases of anus and rectum: Secondary | ICD-10-CM

## 2021-12-23 DIAGNOSIS — M810 Age-related osteoporosis without current pathological fracture: Secondary | ICD-10-CM

## 2021-12-26 LAB — CYTOLOGY - PAP
Comment: NEGATIVE
Diagnosis: NEGATIVE
High risk HPV: NEGATIVE

## 2022-04-27 ENCOUNTER — Other Ambulatory Visit (HOSPITAL_COMMUNITY): Payer: Self-pay | Admitting: *Deleted

## 2022-04-28 ENCOUNTER — Encounter (HOSPITAL_COMMUNITY): Payer: Managed Care, Other (non HMO)

## 2022-05-04 ENCOUNTER — Other Ambulatory Visit (HOSPITAL_COMMUNITY): Payer: Self-pay | Admitting: *Deleted

## 2022-05-05 ENCOUNTER — Inpatient Hospital Stay (HOSPITAL_COMMUNITY): Admission: RE | Admit: 2022-05-05 | Payer: Managed Care, Other (non HMO) | Source: Ambulatory Visit

## 2022-05-08 ENCOUNTER — Ambulatory Visit (HOSPITAL_COMMUNITY)
Admission: RE | Admit: 2022-05-08 | Discharge: 2022-05-08 | Disposition: A | Discharge status: Home / Self Care | Payer: Medicare Other | Source: Ambulatory Visit | Attending: Internal Medicine | Admitting: Internal Medicine

## 2022-05-08 DIAGNOSIS — M810 Age-related osteoporosis without current pathological fracture: Secondary | ICD-10-CM | POA: Diagnosis present

## 2022-05-08 MED ORDER — DENOSUMAB 60 MG/ML ~~LOC~~ SOSY
60.0000 mg | PREFILLED_SYRINGE | Freq: Once | SUBCUTANEOUS | Status: AC
  Administered 2022-05-08: 60 mg via SUBCUTANEOUS

## 2022-05-08 MED ORDER — DENOSUMAB 60 MG/ML ~~LOC~~ SOSY
PREFILLED_SYRINGE | SUBCUTANEOUS | Status: AC
  Filled 2022-05-08: qty 1

## 2022-05-12 ENCOUNTER — Ambulatory Visit (HOSPITAL_COMMUNITY)
Admission: RE | Admit: 2022-05-12 | Discharge: 2022-05-12 | Disposition: A | Discharge status: Home / Self Care | Payer: Medicare Other | Source: Ambulatory Visit | Attending: Cardiovascular Disease | Admitting: Cardiovascular Disease

## 2022-05-12 ENCOUNTER — Other Ambulatory Visit (HOSPITAL_COMMUNITY): Payer: Self-pay | Admitting: Orthopedic Surgery

## 2022-05-12 DIAGNOSIS — M79605 Pain in left leg: Secondary | ICD-10-CM | POA: Diagnosis present

## 2022-05-12 DIAGNOSIS — M7989 Other specified soft tissue disorders: Secondary | ICD-10-CM | POA: Insufficient documentation

## 2022-10-16 ENCOUNTER — Encounter: Payer: Self-pay | Admitting: Psychiatry

## 2022-10-16 ENCOUNTER — Telehealth (INDEPENDENT_AMBULATORY_CARE_PROVIDER_SITE_OTHER): Payer: Medicare Other | Admitting: Psychiatry

## 2022-10-16 DIAGNOSIS — Z8659 Personal history of other mental and behavioral disorders: Secondary | ICD-10-CM | POA: Diagnosis not present

## 2022-10-16 DIAGNOSIS — F3342 Major depressive disorder, recurrent, in full remission: Secondary | ICD-10-CM | POA: Diagnosis not present

## 2022-10-16 DIAGNOSIS — F411 Generalized anxiety disorder: Secondary | ICD-10-CM

## 2022-10-16 MED ORDER — VENLAFAXINE HCL ER 150 MG PO CP24: 300.0000 mg | ORAL_CAPSULE | Freq: Every day | ORAL | 3 refills | Status: DC

## 2022-10-16 NOTE — Progress Notes (Signed)
Jenna Obrien [id number] [birth year] 67 y.o.  Video Visit via My Chart  I connected with pt by My Chart video and verified that I am speaking with the correct person using two identifiers.   I discussed the limitations, risks, security and privacy concerns of performing an evaluation and management service by My Chart  and the availability of in person appointments. I also discussed with the patient that there may be a patient responsible charge related to this service. The patient expressed understanding and agreed to proceed.  I discussed the assessment and treatment plan with the patient. The patient was provided an opportunity to ask questions and all were answered. The patient agreed with the plan and demonstrated an understanding of the instructions.   The patient was advised to call back or seek an in-person evaluation if the symptoms worsen or if the condition fails to improve as anticipated.  I provided 30 minutes of video time during this encounter.  The patient was located at home and the provider was located office. Session from 500-530  Subjective:   Patient ID:  Jenna Obrien is a 67 y.o. (DOB May 03, 1956) female.  Chief Complaint:  Chief Complaint  Patient presents with   Follow-up    Anxiety Patient reports no confusion, decreased concentration, nervous/anxious behavior or suicidal ideas.     Jenna Obrien presents today for follow-up of anxiety and depression.    seen December 2019.  She had been doing fairly well and no meds were changed.  06/2019 appt noted without med changes: Been healthy overall.  Family healthy.  D in [employer] school.  Good she's been able to go to class.  Pleased about that.   Light orange venlafaxine XR did not help but the brownish red color does well.  Is a particular generic [employer]uro[employer]o. 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    09/28/2020 appointment with the following noted: Still able to get desired generic of venlafaxine XR and  doing OK.  Still works from home permanently.  Has it's advantages.  No complaints.   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 67 yo D driving now.  Stress with her lack of motivation for school.   No SE.  Plan continue Effexor XR 150 2 daily as only psych med  11/02/21 appt noted. Doing well with Effexor XR 300 mg daily.  No SE. Covid early Feb.   Stopped alcohol completely and feels better.  Asks question about naltrexone and accamprosate. Asks about other antidepressants that affect serotonin and NE.  10/16/22 appt noted: Good health. Still not drinking.  No sig craving. On Effexor XR 300 mg daily. Asks about venlafaxine LT.   No major memory issues but STM is not as good as it used to be.  Trouble with names.  No other concerns. May try to retire end of the year.  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  Doing largely OK.  Occ moments of emotional distress.  Most of the time mood and anxiety are under control.. Patient reports stable mood and denies depressed or irritable moods.  Patient denies any recent difficulty with anxiety.  Patient denies difficulty with sleep initiation or maintenance. Denies appetite disturbance.  Patient reports that energy and motivation have been good.  Patient denies any difficulty with concentration.  Patient denies any suicidal ideation.  Past Psychiatric Medication Trials: Brief sertraline, nefazodone, venlafaxine 300,  buspirone,  Abilify 5,  Depakote 1000, Zolpidem Patient of this press practice since 1999  Review of Systems:  Review of Systems  Gastrointestinal:        Heartburn   Neurological:  Negative for tremors.  Psychiatric/Behavioral:  Negative for agitation, behavioral problems, confusion, decreased concentration, dysphoric mood, hallucinations, self-injury, sleep disturbance and suicidal ideas. The patient is not nervous/anxious and is not hyperactive.     Medications: I have reviewed the patient's current medications.  Current Outpatient  Medications  Medication Sig Dispense Refill   calcipotriene (DOVONOX) 0.005 % ointment Apply topically 2 (two) times daily.     Cholecalciferol (VITAMIN D3) 125 MCG (5000 UT) TABS Take 5,000 Units by mouth daily.     clobetasol ointment (TEMOVATE) 0.05 % Apply topically.     NASACORT ALLERGY 24HR 55 MCG/ACT AERO nasal inhaler Place 1 spray into both nostrils daily.  6   pantoprazole (PROTONIX) 20 MG tablet TAKE 2 TABLETS BY MOUTH DAILY AND 1 TABLET BY MOUTH WITH DINNER     PROLIA 60 MG/ML SOLN injection      venlafaxine XR (EFFEXOR-XR) 150 MG 24 hr capsule Take 2 capsules (300 mg total) by mouth daily with breakfast. 180 capsule 3   No current facility-administered medications for this visit.    Medication Side Effects: None  Allergies: No Known Allergies  Past Medical History:  Diagnosis Date   Abnormal Pap smear of cervix    in 20's   Anxiety    Depression    DES exposure in utero    Dyspareunia    Family history of colon cancer    Family history of prostate cancer    Family history of uterine cancer    Fibroid    Heart murmur    Osteopenia    Osteoporosis    PVC's (premature ventricular contractions)     Family History  Problem Relation Age of Onset   Diabetes Mother    Hypertension Mother    Thyroid disease Mother    AAA (abdominal aortic aneurysm) Mother    Lung cancer Father 73   Prostate cancer Father        dx in his 26s   Colon cancer Paternal Aunt        dx in her 58s   Colon cancer Paternal Uncle        colon cancer vs colon polyps   Colon cancer Paternal Grandfather        dx over 10s   Head & neck cancer Maternal Uncle        oral cancer   Heart attack Maternal Grandmother    Heart attack Maternal Grandfather    Lung cancer Paternal Uncle        dx in his 19s-50s   Colon cancer Cousin        paternal cousin dx in her 50s-40   Uterine cancer Cousin        paternal cousin    Social History   Socioeconomic History   Marital status: Divorced     Spouse name: Not on file   Number of children: Not on file   Years of education: Not on file   Highest education level: Not on file  Occupational History   Not on file  Tobacco Use   Smoking status: Never   Smokeless tobacco: Never  Vaping Use   Vaping Use: Never used  Substance and Sexual Activity   Alcohol use: Not on file   Drug use: No   Sexual activity: Not Currently    Partners: Male    Birth control/protection: Surgical    Comment:  DES progeny-1st intercourse 67 yo-Fewer than 5 partners  Other Topics Concern   Not on file  Social History Narrative   Not on file   Social Determinants of Health   Financial Resource Strain: Not on file  Food Insecurity: Not on file  Transportation Needs: Not on file  Physical Activity: Not on file  Stress: Not on file  Social Connections: Not on file  Intimate Partner Violence: Not on file    Past Medical History, Surgical history, Social history, and Family history were reviewed and updated as appropriate.   Please see review of systems for further details on the patient's review from today.   Objective:   Physical Exam:  LMP 08/03/1998   Physical Exam Neurological:     Mental Status: She is alert and oriented to person, place, and time.     Cranial Nerves: No dysarthria.  Psychiatric:        Attention and Perception: Attention and perception normal.        Mood and Affect: Mood normal. Mood is not anxious or depressed.        Speech: Speech normal.        Behavior: Behavior is cooperative.        Thought Content: Thought content normal. Thought content is not paranoid or delusional. Thought content does not include homicidal or suicidal ideation. Thought content does not include suicidal plan.        Cognition and Memory: Cognition and memory normal.        Judgment: Judgment normal.     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  Reports anxiety and depressive symptoms are under control. Completely free of alcoholol.  Answered questions about what she read on meds for sobriety promotion.  She does not feel they are necessary now. Pleased with current generic of venlafaxine.  Had poor response from the venlafaxine XR that is an orange color but does well with this particular generic and with the brand.  Supportive therapy on pursuing counseling.      Eating disorder symptoms are under control by her report.  Supportive therapy at maintaining sobriety.  She has done well with this.  She is satisfied with the Effexor at controlling symptoms of depression and anxiety.  We discussed withdrawal symptoms related to Effexor because of its short half-life.  Answered questions about the mechanism of action of venlafaxine versus desvenlafaxine which her daughter takes.  No sig evidence for cog px from venlafaxine .  Rec trial of the following for mild complaints forgetfulness  (NAC) N-Acetylcysteine 2 of the  600 mg capsules daily to help with mild cognitive problems.  It can be combined with a B-complex vitamin as the B-12 and folate which can sometimes enhance the effect.  No med changes today.  Effexor XR 300 mg daily  Because of stability will  wait 1 year before next appointment  Meredith Staggers, MD,  DFAPA    Please see After Visit Summary for patient specific instructions.  Future Appointments  Date Time Provider Department Center  11/10/2022  7:40 AM [facility] 2 [facility]  12/27/2022  9:30 AM [facility] Shirley Friar, MD [facility] None    No orders of the defined types were placed in this encounter.     -------------------------------

## 2022-10-18 ENCOUNTER — Other Ambulatory Visit: Payer: Self-pay | Admitting: Internal Medicine

## 2022-10-18 DIAGNOSIS — Z1231 Encounter for screening mammogram for malignant neoplasm of breast: Secondary | ICD-10-CM

## 2022-11-07 ENCOUNTER — Ambulatory Visit: Payer: Managed Care, Other (non HMO)

## 2022-11-08 ENCOUNTER — Other Ambulatory Visit (HOSPITAL_COMMUNITY): Payer: Self-pay | Admitting: *Deleted

## 2022-11-10 ENCOUNTER — Inpatient Hospital Stay (HOSPITAL_COMMUNITY): Admission: RE | Admit: 2022-11-10 | Payer: Managed Care, Other (non HMO) | Source: Ambulatory Visit

## 2022-11-10 DIAGNOSIS — Z1231 Encounter for screening mammogram for malignant neoplasm of breast: Secondary | ICD-10-CM

## 2022-11-15 ENCOUNTER — Other Ambulatory Visit (HOSPITAL_COMMUNITY): Payer: Self-pay | Admitting: *Deleted

## 2022-11-16 ENCOUNTER — Encounter (HOSPITAL_COMMUNITY): Payer: Managed Care, Other (non HMO)

## 2022-11-22 ENCOUNTER — Encounter (HOSPITAL_COMMUNITY): Payer: Managed Care, Other (non HMO)

## 2022-11-24 ENCOUNTER — Inpatient Hospital Stay (HOSPITAL_COMMUNITY): Admission: RE | Admit: 2022-11-24 | Payer: Managed Care, Other (non HMO) | Source: Ambulatory Visit

## 2022-12-13 NOTE — Progress Notes (Deleted)
67 y.o. G35P0 Divorced Caucasian female here for annual exam.    PCP:     Patient's last menstrual period was 08/03/1998.           Sexually active: {yes no:314532}  The current method of family planning is status post hysterectomy.    Exercising: {yes no:314532}  {types:19826} Smoker:  no  Health Maintenance: Pap:  12/23/21 neg: HR HPV neg, 10/20/20 neg: HR HPV neg History of abnormal Pap:  yes MMG:  02/24/13 Breast Density Cat C, BI-RADS CAT 1 neg Colonoscopy:  2020 normal, 5 yr f/u for family hx BMD:   02/14/16  Result  osteoporosis TDaP:  PCP Gardasil:   no HIV: n/a Hep C: n/a Screening Labs:  Hb today: ***, Urine today: ***   reports that she has never smoked. She has never used smokeless tobacco. She reports that she does not use drugs.  Past Medical History:  Diagnosis Date   Abnormal Pap smear of cervix    in 20's   Anxiety    Depression    DES exposure in utero    Dyspareunia    Family history of colon cancer    Family history of prostate cancer    Family history of uterine cancer    Fibroid    Heart murmur    Osteopenia    Osteoporosis    PVC's (premature ventricular contractions)     Past Surgical History:  Procedure Laterality Date   ABDOMINAL HYSTERECTOMY  08/1998   TAH--Due to fibroids--Dr. Nicholas Lose   AUGMENTATION MAMMAPLASTY     BREAST EXCISIONAL BIOPSY      Current Outpatient Medications  Medication Sig Dispense Refill   calcipotriene (DOVONOX) 0.005 % ointment Apply topically 2 (two) times daily.     Cholecalciferol (VITAMIN D3) 125 MCG (5000 UT) TABS Take 5,000 Units by mouth daily.     clobetasol ointment (TEMOVATE) 0.05 % Apply topically.     NASACORT ALLERGY 24HR 55 MCG/ACT AERO nasal inhaler Place 1 spray into both nostrils daily.  6   pantoprazole (PROTONIX) 20 MG tablet TAKE 2 TABLETS BY MOUTH DAILY AND 1 TABLET BY MOUTH WITH DINNER     PROLIA 60 MG/ML SOLN injection      venlafaxine XR (EFFEXOR-XR) 150 MG 24 hr capsule Take 2 capsules (300  mg total) by mouth daily with breakfast. 180 capsule 3   No current facility-administered medications for this visit.    Family History  Problem Relation Age of Onset   Diabetes Mother    Hypertension Mother    Thyroid disease Mother    AAA (abdominal aortic aneurysm) Mother    Lung cancer Father 14   Prostate cancer Father        dx in his 61s   Colon cancer Paternal Aunt        dx in her 13s   Colon cancer Paternal Uncle        colon cancer vs colon polyps   Colon cancer Paternal Grandfather        dx over 36s   Head & neck cancer Maternal Uncle        oral cancer   Heart attack Maternal Grandmother    Heart attack Maternal Grandfather    Lung cancer Paternal Uncle        dx in his 42s-50s   Colon cancer Cousin        paternal cousin dx in her 37s-40   Uterine cancer Cousin  paternal cousin    Review of Systems  Exam:   LMP 08/03/1998     General appearance: alert, cooperative and appears stated age Head: normocephalic, without obvious abnormality, atraumatic Neck: no adenopathy, supple, symmetrical, trachea midline and thyroid normal to inspection and palpation Lungs: clear to auscultation bilaterally Breasts: normal appearance, no masses or tenderness, No nipple retraction or dimpling, No nipple discharge or bleeding, No axillary adenopathy Heart: regular rate and rhythm Abdomen: soft, non-tender; no masses, no organomegaly Extremities: extremities normal, atraumatic, no cyanosis or edema Skin: skin color, texture, turgor normal. No rashes or lesions Lymph nodes: cervical, supraclavicular, and axillary nodes normal. Neurologic: grossly normal  Pelvic: External genitalia:  no lesions              No abnormal inguinal nodes palpated.              Urethra:  normal appearing urethra with no masses, tenderness or lesions              Bartholins and Skenes: normal                 Vagina: normal appearing vagina with normal color and discharge, no lesions               Cervix: no lesions              Pap taken: {yes no:314532} Bimanual Exam:  Uterus:  normal size, contour, position, consistency, mobility, non-tender              Adnexa: no mass, fullness, tenderness              Rectal exam: {yes no:314532}.  Confirms.              Anus:  normal sphincter tone, no lesions  Chaperone was present for exam:  ***  Assessment:   Well woman visit with gynecologic exam.   Plan: Mammogram screening discussed. Self breast awareness reviewed. Pap and HR HPV as above. Guidelines for Calcium, Vitamin D, regular exercise program including cardiovascular and weight bearing exercise.   Follow up annually and prn.   Additional counseling given.  {yes [id number]. _______ minutes face to face time of which over 50% was spent in counseling.    After visit summary provided.

## 2022-12-20 ENCOUNTER — Other Ambulatory Visit: Payer: Self-pay | Admitting: Internal Medicine

## 2022-12-20 ENCOUNTER — Ambulatory Visit
Admission: RE | Admit: 2022-12-20 | Discharge: 2022-12-20 | Disposition: A | Discharge status: Home / Self Care | Payer: Medicare Other | Source: Ambulatory Visit | Attending: Internal Medicine | Admitting: Internal Medicine

## 2022-12-20 DIAGNOSIS — Z1231 Encounter for screening mammogram for malignant neoplasm of breast: Secondary | ICD-10-CM

## 2022-12-22 ENCOUNTER — Ambulatory Visit (HOSPITAL_COMMUNITY)
Admission: RE | Admit: 2022-12-22 | Discharge: 2022-12-22 | Disposition: A | Discharge status: Home / Self Care | Payer: Medicare Other | Source: Ambulatory Visit | Attending: Internal Medicine | Admitting: Internal Medicine

## 2022-12-22 DIAGNOSIS — M810 Age-related osteoporosis without current pathological fracture: Secondary | ICD-10-CM | POA: Diagnosis present

## 2022-12-22 MED ORDER — DENOSUMAB 60 MG/ML ~~LOC~~ SOSY
60.0000 mg | PREFILLED_SYRINGE | Freq: Once | SUBCUTANEOUS | Status: AC
  Administered 2022-12-22: 60 mg via SUBCUTANEOUS

## 2022-12-22 MED ORDER — DENOSUMAB 60 MG/ML ~~LOC~~ SOSY
PREFILLED_SYRINGE | SUBCUTANEOUS | Status: AC
  Filled 2022-12-22: qty 1

## 2022-12-27 ENCOUNTER — Ambulatory Visit: Payer: Medicare Other | Admitting: Obstetrics and Gynecology

## 2023-02-09 IMAGING — MG DIGITAL SCREENING BREAST BILAT IMPLANT W/ TOMO W/ CAD
9 of 12 series · 9 of 28 positions shown · non-contrast
Comparison: Previous exam(s).

CLINICAL DATA: Screening.

EXAM:
DIGITAL SCREENING BILATERAL MAMMOGRAM WITH IMPLANTS, CAD AND
TOMOSYNTHESIS
TECHNIQUE: Bilateral screening digital craniocaudal and mediolateral oblique
mammograms were obtained. Bilateral screening digital breast
tomosynthesis was performed. The images were evaluated with
computer-aided detection. Standard and/or implant displaced views
were performed.

[L MLO]
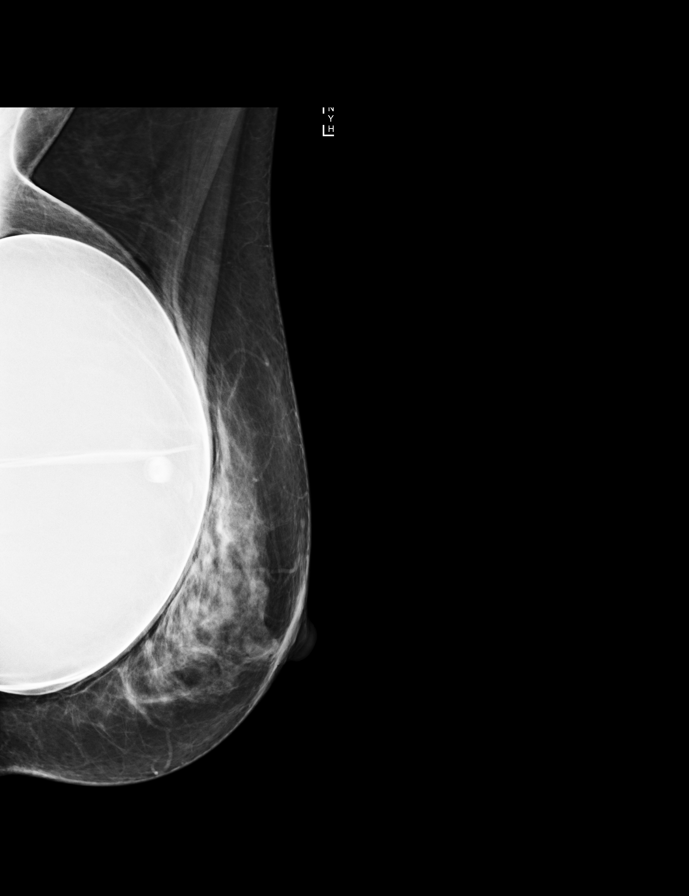

[R MLO]
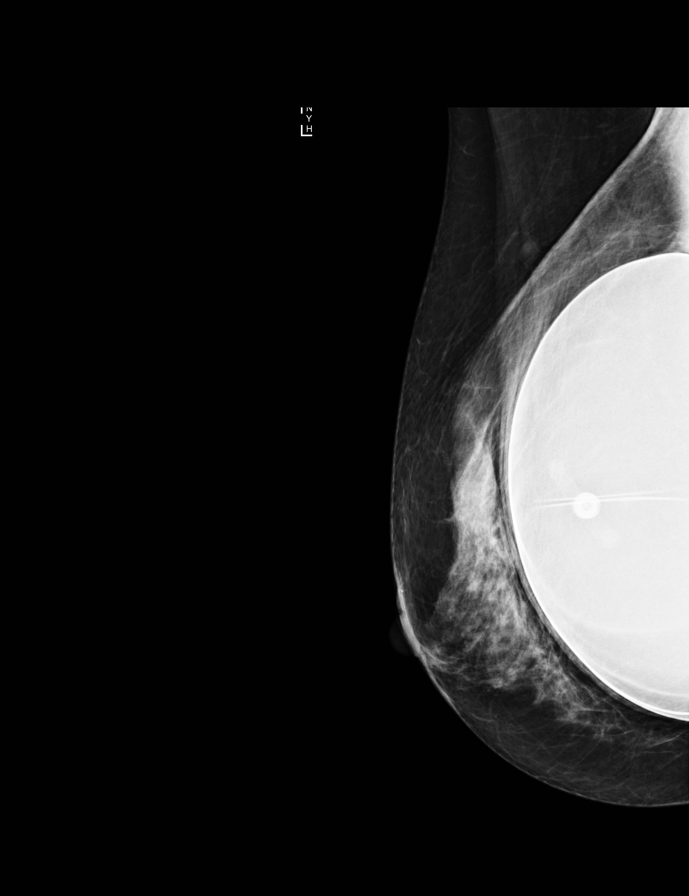

[L CC]
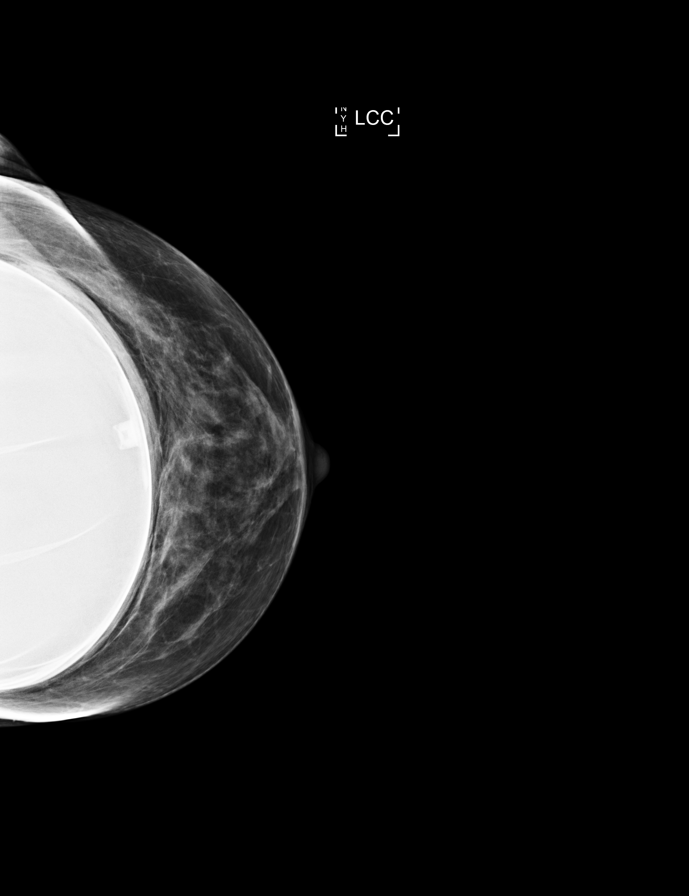

[R CC]
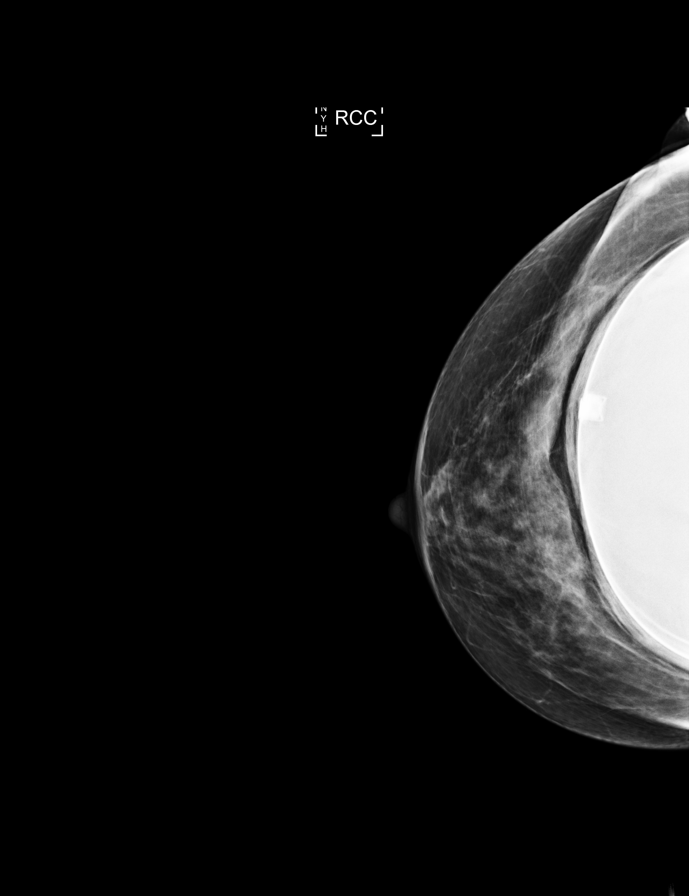

[L CC synth-2D]
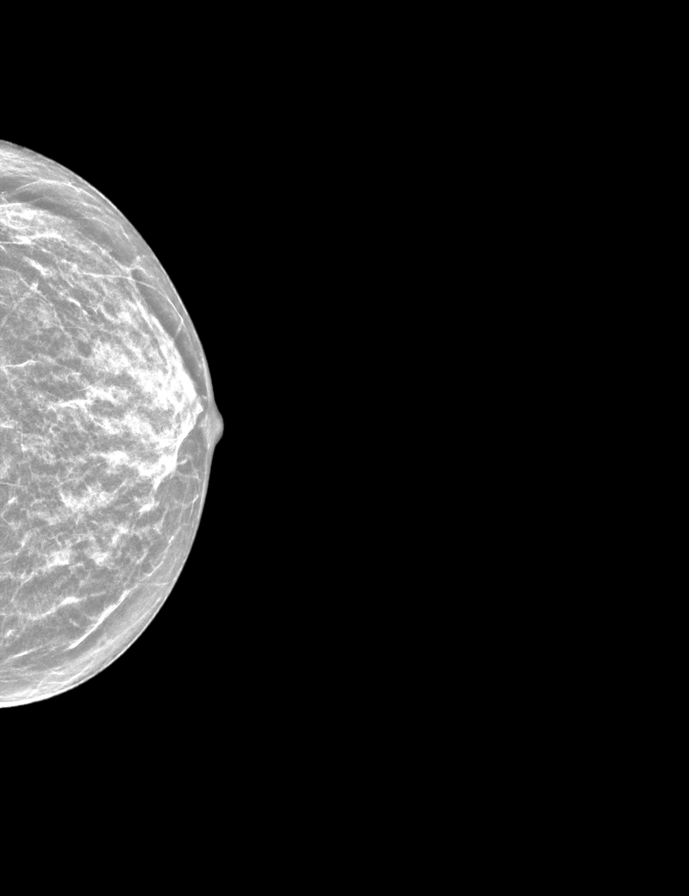

[R CC synth-2D]
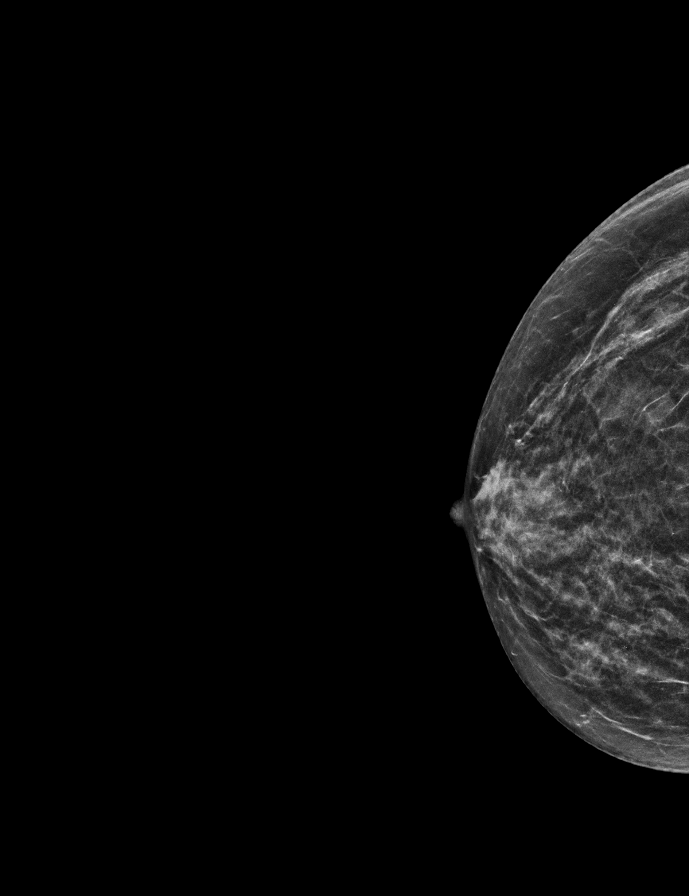

[R MLO synth-2D]
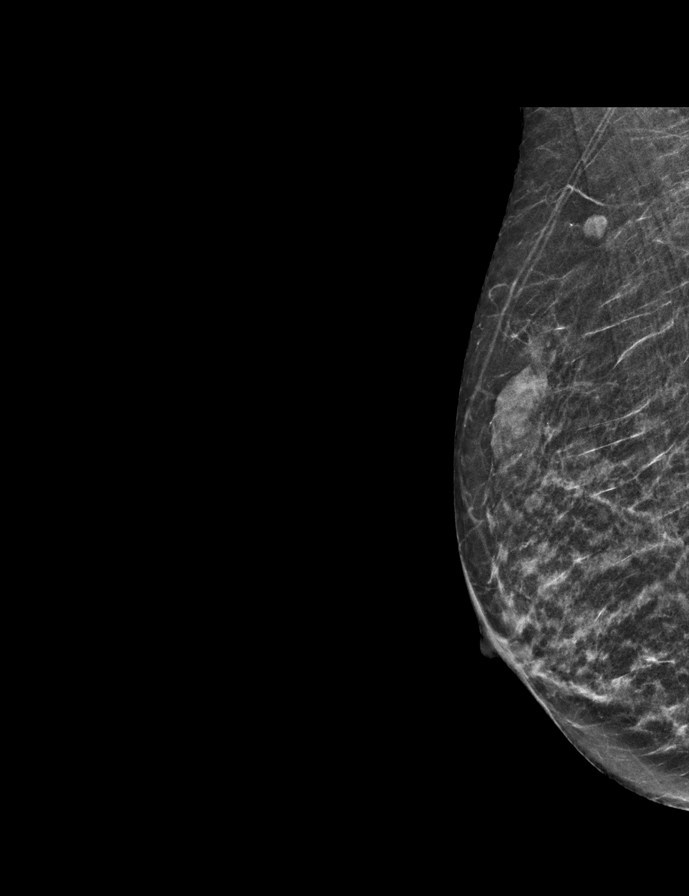

[L MLO synth-2D]
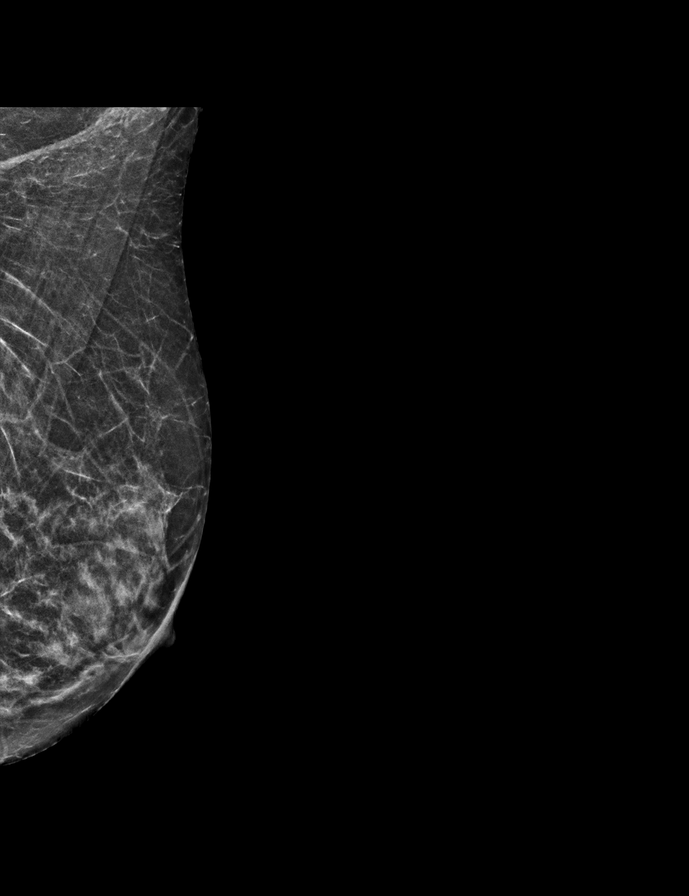

[L MLOID BREAST TOMOSYNTHESIS IMAGE tomo · tomo slice 20/39.0]
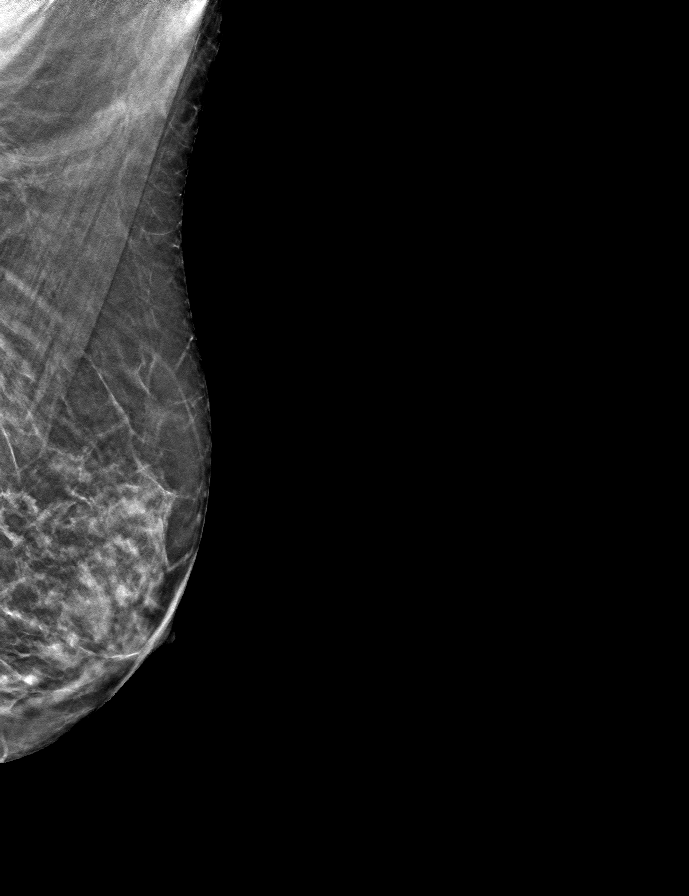

[9 of 28 positions shown; findings below may reference images not displayed]

ACR Breast Density Category c: The breast tissue is heterogeneously
dense, which may obscure small masses.
FINDINGS: The patient has retropectoral implants. There are no findings
suspicious for malignancy.
IMPRESSION: No mammographic evidence of malignancy. A result letter of this
screening mammogram will be mailed directly to the patient.

RECOMMENDATION:
Screening mammogram in one year. (Code:LT-E-7TH)

BI-RADS CATEGORY  1:  Negative.

## 2023-04-03 NOTE — Progress Notes (Deleted)
67 y.o. G3P0 Divorced Caucasian female here for annual exam.    PCP:     Patient's last menstrual period was 08/03/1998.           Sexually active: {yes no:314532}  The current method of family planning is status post hysterectomy.    Exercising: {yes no:314532}  {types:19826} Smoker:  no  Health Maintenance: Pap:  12/23/21 neg: HR HPV neg, 10/20/20 neg: HR HPV neg History of abnormal Pap:  yes MMG:  10-18-21 normal BiRADS 1 Cat.C  Colonoscopy:  2020 normal BMD:   02/14/16  Result  osteoporosis TDaP:  PCP Gardasil:   no HIV: n/a Hep C: n/a Screening Labs:  Hb today: ***, Urine today: ***   reports that she has never smoked. She has never used smokeless tobacco. She reports that she does not use drugs.  Past Medical History:  Diagnosis Date   Abnormal Pap smear of cervix    in 20's   Anxiety    Depression    DES exposure in utero    Dyspareunia    Family history of colon cancer    Family history of prostate cancer    Family history of uterine cancer    Fibroid    Heart murmur    Osteopenia    Osteoporosis    PVC's (premature ventricular contractions)     Past Surgical History:  Procedure Laterality Date   ABDOMINAL HYSTERECTOMY  08/1998   TAH--Due to fibroids--Dr. Nicholas Lose   AUGMENTATION MAMMAPLASTY     BREAST EXCISIONAL BIOPSY      Current Outpatient Medications  Medication Sig Dispense Refill   calcipotriene (DOVONOX) 0.005 % ointment Apply topically 2 (two) times daily.     Cholecalciferol (VITAMIN D3) 125 MCG (5000 UT) TABS Take 5,000 Units by mouth daily.     clobetasol ointment (TEMOVATE) 0.05 % Apply topically.     NASACORT ALLERGY 24HR 55 MCG/ACT AERO nasal inhaler Place 1 spray into both nostrils daily.  6   pantoprazole (PROTONIX) 20 MG tablet TAKE 2 TABLETS BY MOUTH DAILY AND 1 TABLET BY MOUTH WITH DINNER     PROLIA 60 MG/ML SOLN injection      venlafaxine XR (EFFEXOR-XR) 150 MG 24 hr capsule Take 2 capsules (300 mg total) by mouth daily with breakfast.  180 capsule 3   No current facility-administered medications for this visit.    Family History  Problem Relation Age of Onset   Diabetes Mother    Hypertension Mother    Thyroid disease Mother    AAA (abdominal aortic aneurysm) Mother    Lung cancer Father 43   Prostate cancer Father        dx in his 110s   Head & neck cancer Maternal Uncle        oral cancer   Colon cancer Paternal Aunt        dx in her 8s   Colon cancer Paternal Uncle        colon cancer vs colon polyps   Lung cancer Paternal Uncle        dx in his 57s-50s   Heart attack Maternal Grandmother    Heart attack Maternal Grandfather    Colon cancer Paternal Grandfather        dx over 90s   Colon cancer Cousin        paternal cousin dx in her 63s-40   Uterine cancer Cousin        paternal cousin   Breast cancer Neg Hx  Review of Systems  Exam:   LMP 08/03/1998     General appearance: alert, cooperative and appears stated age Head: normocephalic, without obvious abnormality, atraumatic Neck: no adenopathy, supple, symmetrical, trachea midline and thyroid normal to inspection and palpation Lungs: clear to auscultation bilaterally Breasts: normal appearance, no masses or tenderness, No nipple retraction or dimpling, No nipple discharge or bleeding, No axillary adenopathy Heart: regular rate and rhythm Abdomen: soft, non-tender; no masses, no organomegaly Extremities: extremities normal, atraumatic, no cyanosis or edema Skin: skin color, texture, turgor normal. No rashes or lesions Lymph nodes: cervical, supraclavicular, and axillary nodes normal. Neurologic: grossly normal  Pelvic: External genitalia:  no lesions              No abnormal inguinal nodes palpated.              Urethra:  normal appearing urethra with no masses, tenderness or lesions              Bartholins and Skenes: normal                 Vagina: normal appearing vagina with normal color and discharge, no lesions              Cervix:  no lesions              Pap taken: {yes no:314532} Bimanual Exam:  Uterus:  normal size, contour, position, consistency, mobility, non-tender              Adnexa: no mass, fullness, tenderness              Rectal exam: {yes no:314532}.  Confirms.              Anus:  normal sphincter tone, no lesions  Chaperone was present for exam:  ***  Assessment:   Well woman visit with gynecologic exam.   Plan: Mammogram screening discussed. Self breast awareness reviewed. Pap and HR HPV as above. Guidelines for Calcium, Vitamin D, regular exercise program including cardiovascular and weight bearing exercise.   Follow up annually and prn.   Additional counseling given.  {yes [id number]. _______ minutes face to face time of which over 50% was spent in counseling.    After visit summary provided.    '

## 2023-04-17 ENCOUNTER — Ambulatory Visit: Payer: Medicare Other | Admitting: Obstetrics and Gynecology

## 2023-05-14 ENCOUNTER — Ambulatory Visit (INDEPENDENT_AMBULATORY_CARE_PROVIDER_SITE_OTHER): Payer: Medicare Other | Admitting: Podiatry

## 2023-05-14 ENCOUNTER — Ambulatory Visit (INDEPENDENT_AMBULATORY_CARE_PROVIDER_SITE_OTHER): Payer: Medicare Other

## 2023-05-14 ENCOUNTER — Encounter: Payer: Self-pay | Admitting: Podiatry

## 2023-05-14 VITALS — Ht 63.0 in | Wt 109.0 lb

## 2023-05-14 DIAGNOSIS — M21619 Bunion of unspecified foot: Secondary | ICD-10-CM

## 2023-05-14 DIAGNOSIS — M21611 Bunion of right foot: Secondary | ICD-10-CM

## 2023-05-14 DIAGNOSIS — L84 Corns and callosities: Secondary | ICD-10-CM

## 2023-05-14 NOTE — Progress Notes (Signed)
Subjective:   Patient ID: Jenna Obrien, female   DOB: 67 y.o.   MRN: [id number]   HPI Patient presents with painful bunion deformity right that is gradually more of an issue and states that she has tried wider shoes she has tried cushion and trimmings without relief.  Patient does not smoke likes to be active   ROS      Objective:  Physical Exam  Neurovascular status intact large structural hyperostosis medial aspect first metatarsal head right deviation of the big toe against the second toe with frontal plane rotation keratotic lesion along the plantar surface of the first metatarsal and towards the hallux.  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  Discussed treatment options patient would like to have something more conservative if possible and I have recommended we could do a distal osteotomy that even though we will get full correction will take care of most likely of the structural malalignment and should be a safe procedure for her.  I did review this versus a proximal fusion and went over pros and cons and so far this is the alternative she is opted for.  Patient will be seen back tentatively scheduled for surgery in January all questions answered today along with the possibility for an Akin type osteotomy  X-rays indicate there is significant elevation of the intermetatarsal angle approximately 16 to 17 degrees with moderate rotation of the big toe

## 2023-06-06 ENCOUNTER — Encounter: Payer: Medicare Other | Admitting: Radiology

## 2023-07-03 ENCOUNTER — Other Ambulatory Visit (HOSPITAL_COMMUNITY): Payer: Self-pay

## 2023-07-06 ENCOUNTER — Ambulatory Visit (HOSPITAL_COMMUNITY)
Admission: RE | Admit: 2023-07-06 | Discharge: 2023-07-06 | Disposition: A | Discharge status: Home / Self Care | Payer: Managed Care, Other (non HMO) | Source: Ambulatory Visit | Attending: Internal Medicine | Admitting: Internal Medicine

## 2023-07-06 DIAGNOSIS — M810 Age-related osteoporosis without current pathological fracture: Secondary | ICD-10-CM | POA: Insufficient documentation

## 2023-07-06 MED ORDER — DENOSUMAB 60 MG/ML ~~LOC~~ SOSY
60.0000 mg | PREFILLED_SYRINGE | Freq: Once | SUBCUTANEOUS | Status: AC
  Administered 2023-07-06: 60 mg via SUBCUTANEOUS

## 2023-07-06 MED ORDER — DENOSUMAB 60 MG/ML ~~LOC~~ SOSY
PREFILLED_SYRINGE | SUBCUTANEOUS | Status: AC
  Filled 2023-07-06: qty 1

## 2023-07-09 ENCOUNTER — Encounter: Payer: Self-pay | Admitting: Podiatry

## 2023-07-09 ENCOUNTER — Ambulatory Visit (INDEPENDENT_AMBULATORY_CARE_PROVIDER_SITE_OTHER): Payer: Medicare Other | Admitting: Podiatry

## 2023-07-09 DIAGNOSIS — M21611 Bunion of right foot: Secondary | ICD-10-CM | POA: Diagnosis not present

## 2023-07-09 DIAGNOSIS — M21619 Bunion of unspecified foot: Secondary | ICD-10-CM

## 2023-07-10 NOTE — Progress Notes (Signed)
 Subjective:   Patient ID: Jenna Obrien, female   DOB: 68 y.o.   MRN: [id number]   HPI Patient presents for consultation for chronic bunion deformity right.  Would like the procedure done that she can be ambulatory due to her activities and responsibilities understanding the deformity neurovasc   ROS      Objective:  Physical Exam  There are status intact with patient found to have good digital perfusion well-oriented with structural bunion deformity right red and painful when pressed failure to respond conservatively to wider shoes cushioning is anti-inflammatories.     Assessment:  HAV deformity right significant nature     Plan:  H&P reviewed x-rays.  We are going to do a distal osteotomy even though I will not be able to get full correction but this will allow it to be weightbearing and ambulatory.  I did explain the procedure risk patient wants surgery understanding risk and today after extensive review she signed consent form understanding risk and we will not get full correction.  At this point also we may do an [facility] type procedure due to frontal and transverse plane rotation of the big toe and I explained this to her and she signed a consent form for that.  Air fracture walker dispensed fitted properly to her lower leg and I want her to wear this prior to procedure just to get used to it and find a shoe on the other foot which fits well and she understands total recovery takes approximately 6 months

## 2023-07-23 ENCOUNTER — Encounter: Payer: PRIVATE HEALTH INSURANCE | Admitting: Podiatry

## 2023-07-23 MED ORDER — ONDANSETRON HCL 4 MG PO TABS: 4.0000 mg | ORAL_TABLET | Freq: Three times a day (TID) | ORAL | 0 refills | Status: DC | PRN

## 2023-07-23 MED ORDER — OXYCODONE-ACETAMINOPHEN 10-325 MG PO TABS: 1.0000 | ORAL_TABLET | ORAL | 0 refills | Status: DC | PRN

## 2023-07-23 NOTE — Addendum Note (Signed)
Addended by: Lenn Sink on: 07/23/2023 02:16 PM   Modules accepted: Orders

## 2023-07-24 DIAGNOSIS — M2011 Hallux valgus (acquired), right foot: Secondary | ICD-10-CM | POA: Diagnosis not present

## 2023-07-30 ENCOUNTER — Ambulatory Visit (INDEPENDENT_AMBULATORY_CARE_PROVIDER_SITE_OTHER): Payer: Medicare Other

## 2023-07-30 ENCOUNTER — Encounter: Payer: Self-pay | Admitting: Podiatry

## 2023-07-30 ENCOUNTER — Ambulatory Visit (INDEPENDENT_AMBULATORY_CARE_PROVIDER_SITE_OTHER): Payer: PRIVATE HEALTH INSURANCE | Admitting: Podiatry

## 2023-07-30 DIAGNOSIS — M21619 Bunion of unspecified foot: Secondary | ICD-10-CM

## 2023-07-30 DIAGNOSIS — Z9889 Other specified postprocedural states: Secondary | ICD-10-CM | POA: Diagnosis not present

## 2023-07-30 DIAGNOSIS — M21612 Bunion of left foot: Secondary | ICD-10-CM

## 2023-07-30 NOTE — Progress Notes (Signed)
Subjective:   Patient ID: Jenna Obrien, female   DOB: 68 y.o.   MRN: [id number]   HPI Patient states doing very well with surgery very pleased   ROS      Objective:  Physical Exam  Neuro vascular status intact negative Denna Haggard' sign noted wound edges coapted well hallux rectus position good range of motion     Assessment:  Doing well post osteotomy metatarsal head first     Plan:  Age P reviewed x-rays reviewed and went ahead today continue with elevation compression and immobilization and reappoint to recheck again in the next 3 weeks earlier if needed with surgical shoe dispensed and continued boot or shoe usage  X-rays indicate osteotomies healing well fixation in place excellent reduction deformity

## 2023-08-06 ENCOUNTER — Encounter: Payer: PRIVATE HEALTH INSURANCE | Admitting: Podiatry

## 2023-08-13 ENCOUNTER — Encounter: Payer: PRIVATE HEALTH INSURANCE | Admitting: Podiatry

## 2023-08-20 ENCOUNTER — Ambulatory Visit (INDEPENDENT_AMBULATORY_CARE_PROVIDER_SITE_OTHER): Payer: Medicare Other | Admitting: Podiatry

## 2023-08-20 ENCOUNTER — Ambulatory Visit (INDEPENDENT_AMBULATORY_CARE_PROVIDER_SITE_OTHER): Payer: Medicare Other

## 2023-08-20 ENCOUNTER — Encounter: Payer: Self-pay | Admitting: Podiatry

## 2023-08-20 DIAGNOSIS — Z9889 Other specified postprocedural states: Secondary | ICD-10-CM | POA: Diagnosis not present

## 2023-08-20 NOTE — Progress Notes (Signed)
Subjective:   Patient ID: Jenna Obrien, female   DOB: 68 y.o.   MRN: [id number]   HPI Patient states doing very well with surgery very pleased so far   ROS      Objective:  Physical Exam  Neurovascular status intact negative Denna Haggard' sign noted wound edges coapted well hallux rectus position good range of motion      Assessment:  Patient doing well post osteotomy arthroplasty     Plan:  H&P x-ray reviewed continue range of motion exercise patient to be seen back very pleased so far  X-rays indicate good healing osteotomy good correction underlying deformity fixation in place

## 2023-08-31 ENCOUNTER — Telehealth: Payer: Self-pay

## 2023-08-31 NOTE — Telephone Encounter (Signed)
 Patient had a colonoscopy in 2020 with Dr. Kinnie Scales. Advised patient that we would need the colonoscopy and pathology report to be reviewed. Patient states she does not have a specific doctor with our practice and patient is requesting transfer due to Dr. Kinnie Scales retiring. Patient states she gets a colonoscopy every 5 years due to family history of colon cancer.

## 2023-08-31 NOTE — Telephone Encounter (Signed)
 Good Afternoon Dr. Doy Hutching,   Supervising MD for today 2/28 PM    We received a referral from patients PCP Dr. Martha Clan for patient to be seen for colonoscopy.  Patient had a colonoscopy in 2020 with Dr. Kinnie Scales and states that she is supposed to have a colonoscopy every 5 years due to a family history of colon cancer. Patient is requesting transfer of care due to Dr. Kinnie Scales being retired. Patient has had her 2020 colonoscopy report and pathology report sent to be reviewed. Those records can be found  under the media tab. Will you please review and advise on scheduling patient?   Thank you.

## 2023-09-07 ENCOUNTER — Encounter: Payer: Self-pay | Admitting: Pediatrics

## 2023-09-07 NOTE — Telephone Encounter (Signed)
 Patient has been scheduled for OV on 3/20 at 2:00 for PV and colon on 4/10 at 2:00

## 2023-09-20 ENCOUNTER — Ambulatory Visit (AMBULATORY_SURGERY_CENTER)

## 2023-09-20 VITALS — Ht 63.0 in | Wt 118.0 lb

## 2023-09-20 DIAGNOSIS — Z800 Family history of malignant neoplasm of digestive organs: Secondary | ICD-10-CM

## 2023-09-20 DIAGNOSIS — Z860100 Personal history of colon polyps, unspecified: Secondary | ICD-10-CM

## 2023-09-20 MED ORDER — NA SULFATE-K SULFATE-MG SULF 17.5-3.13-1.6 GM/177ML PO SOLN: 1.0000 | Freq: Once | ORAL | 0 refills | Status: AC

## 2023-09-20 NOTE — Progress Notes (Signed)
 No egg or soy allergy known to patient  No issues known to pt with past sedation with any surgeries or procedures Patient denies ever being told they had issues or difficulty with intubation  No FH of Malignant Hyperthermia Pt is not on diet pills Pt is not on  home 02  Pt is not on blood thinners  Pt denies issues with constipation  No A fib or A flutter. Hx of PVCs.  Have any cardiac testing pending-- no  LOA: independent  Prep: suprep  Patient's chart reviewed by Cathlyn Parsons CNRA prior to previsit and patient appropriate for the [facility].  Previsit completed and red dot placed by patient's name on their procedure day (on provider's schedule).     PV completed with patient. Prep instructions sent via mychart and home address.

## 2023-09-20 NOTE — Patient Instructions (Signed)
 [facility] has implemented a new process for scheduling procedures.  Please note your arrival time for the [facility] is your appointment time that is shown on your written instructions.  Please do not arrive one hour prior to the time listed in your instructions.  Please ignore any outside notifications to arrive one hour early.  We apologize for any confusion and look forward to seeing you for your procedure.

## 2023-10-10 NOTE — Progress Notes (Unsigned)
 [facility] History and Physical   Primary Care Physician:  Cleatis Polka., MD   Reason for Procedure:  History of colon polyps, family history of colorectal cancer in multiple second-degree relatives  Plan:    Surveillance colonoscopy   HPI: Jenna Obrien is a 68 y.o. female undergoing surveillance colonoscopy for a personal history of colon polyps as well as a family history of colorectal cancer in multiple second-degree relatives.  Patient's last colonoscopy was performed in 2020 with a 9 mm polyp removed-pathology not available.  There is a pertinent family history of colorectal cancer in a paternal aunt, paternal uncle, paternal grandfather and paternal cousin.  Patient denies change in bowel habits or rectal bleeding at the time of this procedure.  Reports undergoing colonoscopy every 5 years due to family history.   Past Medical History:  Diagnosis Date   Abnormal Pap smear of cervix    in 20's   Anxiety    Depression    DES exposure in utero    Dyspareunia    Family history of colon cancer    Family history of prostate cancer    Family history of uterine cancer    Fibroid    Heart murmur    Osteopenia    Osteoporosis    PVC's (premature ventricular contractions)     Past Surgical History:  Procedure Laterality Date   ABDOMINAL HYSTERECTOMY  08/1998   TAH--Due to fibroids--Dr. Nicholas Lose   AUGMENTATION MAMMAPLASTY     BREAST EXCISIONAL BIOPSY      Prior to Admission medications   Medication Sig Start Date End Date Taking? Authorizing Provider  Acetylcysteine (NAC 600) 600 MG CAPS Take 2 capsules by mouth daily at 6 (six) AM.    [provider]  b complex vitamins capsule Take 1 capsule by mouth daily.    [provider]  calcipotriene (DOVONOX) 0.005 % ointment Apply topically 2 (two) times daily. 03/15/21   [provider]  Cholecalciferol (VITAMIN D3) 125 MCG (5000 UT) TABS Take 5,000 Units by mouth daily.    [provider]  clobetasol ointment (TEMOVATE) 0.05 % Apply topically. 08/18/20   [provider]  NASACORT ALLERGY 24HR 55 MCG/ACT AERO nasal inhaler Place 1 spray into both nostrils daily as needed. 06/08/14   [provider]  ondansetron (ZOFRAN) 4 MG tablet Take 1 tablet (4 mg total) by mouth every 8 (eight) hours as needed for nausea or vomiting. Patient not taking: Reported on 09/20/2023 07/23/23   Lenn Sink, DPM  oxyCODONE-acetaminophen (PERCOCET) 10-325 MG tablet Take 1 tablet by mouth every 4 (four) hours as needed for pain. Patient not taking: Reported on 09/20/2023 07/23/23   Lenn Sink, DPM  pantoprazole (PROTONIX) 20 MG tablet TAKE 2 TABLETS BY MOUTH DAILY AND 1 TABLET BY MOUTH WITH DINNER 05/24/20   [provider]  PROLIA 60 MG/ML SOLN injection Inject 60 mg into the skin every 6 (six) months. 12/11/16   [provider]  Turmeric (QC TUMERIC COMPLEX) 500 MG CAPS Take 1 capsule by mouth daily at 6 (six) AM.    [provider]  venlafaxine XR (EFFEXOR-XR) 150 MG 24 hr capsule Take 2 capsules (300 mg total) by mouth daily with breakfast. 10/16/22   Cottle, Steva Ready., MD    Current Outpatient Medications  Medication Sig Dispense Refill   Acetylcysteine (NAC 600) 600 MG CAPS Take 2 capsules by mouth daily at 6 (six) AM.     b  complex vitamins capsule Take 1 capsule by mouth daily.     calcipotriene (DOVONOX) 0.005 % ointment Apply topically 2 (two) times daily.     Cholecalciferol (VITAMIN D3) 125 MCG (5000 UT) TABS Take 5,000 Units by mouth daily.     clobetasol ointment (TEMOVATE) 0.05 % Apply topically.     NASACORT ALLERGY 24HR 55 MCG/ACT AERO nasal inhaler Place 1 spray into both nostrils daily as needed.  6   pantoprazole (PROTONIX) 20 MG tablet TAKE 2 TABLETS BY MOUTH DAILY AND 1 TABLET BY MOUTH WITH DINNER     Turmeric (QC TUMERIC COMPLEX) 500 MG CAPS Take 1 capsule by mouth daily at 6 (six) AM.     venlafaxine XR (EFFEXOR-XR)  150 MG 24 hr capsule Take 2 capsules (300 mg total) by mouth daily with breakfast. 180 capsule 3   ondansetron (ZOFRAN) 4 MG tablet Take 1 tablet (4 mg total) by mouth every 8 (eight) hours as needed for nausea or vomiting. (Patient not taking: Reported on 10/11/2023) 20 tablet 0   oxyCODONE-acetaminophen (PERCOCET) 10-325 MG tablet Take 1 tablet by mouth every 4 (four) hours as needed for pain. (Patient not taking: Reported on 10/11/2023) 20 tablet 0   PROLIA 60 MG/ML SOLN injection Inject 60 mg into the skin every 6 (six) months.     Current Facility-Administered Medications  Medication Dose Route Frequency Provider Last Rate Last Admin   0.9 %  sodium chloride infusion  500 mL Intravenous Once [name], Durene Romans, MD        Allergies as of 10/11/2023   (No Known Allergies)    Family History  Problem Relation Age of Onset   Diabetes Mother    Hypertension Mother    Thyroid disease Mother    AAA (abdominal aortic aneurysm) Mother    Colon polyps Father    Lung cancer Father 58   Prostate cancer Father        dx in his 51s   Head & neck cancer Maternal Uncle        oral cancer   Colon cancer Paternal Aunt        dx in her 81s   Colon cancer Paternal Uncle        colon cancer vs colon polyps   Lung cancer Paternal Uncle        dx in his 35s-50s   Heart attack Maternal Grandmother    Heart attack Maternal Grandfather    Colon cancer Paternal Grandfather        dx over 32s   Rectal cancer Cousin    Colon polyps Cousin    Colon cancer Cousin        paternal cousin dx in her 66s-40   Uterine cancer Cousin        paternal cousin   Rectal cancer Other    Colon polyps Other    Breast cancer Neg Hx    Stomach cancer Neg Hx     Social History   Socioeconomic History   Marital status: Divorced    Spouse name: Not on file   Number of children: Not on file   Years of education: Not on file   Highest education level: Not on file  Occupational History   Not on file  Tobacco Use    Smoking status: Never   Smokeless tobacco: Never  Vaping Use   Vaping status: Never Used  Substance and Sexual Activity   Alcohol use: Not on file   Drug use: No  Sexual activity: Not Currently    Partners: Male    Birth control/protection: Surgical    Comment: DES exposure-1st intercourse 68 yo-Fewer than 5 partners  Other Topics Concern   Not on file  Social History Narrative   Not on file   Social Drivers of Health   Financial Resource Strain: Not on file  Food Insecurity: Not on file  Transportation Needs: Not on file  Physical Activity: Not on file  Stress: Not on file  Social Connections: Not on file  Intimate Partner Violence: Not on file    Review of Systems:  All other review of systems negative except as mentioned in the HPI.  Physical Exam: Vital signs BP (!) 117/55   Pulse 79   Temp 98.6 F (37 C)   Resp 16   Ht 5\' 3"  (1.6 m)   Wt 110 lb (49.9 kg)   LMP 08/03/1998   SpO2 100%   BMI 19.49 kg/m   General:   Alert,  Well-developed, well-nourished, pleasant and cooperative in NAD Airway:  Mallampati 1 Lungs:  Clear throughout to auscultation.   Heart:  Regular rate and rhythm; no murmurs, clicks, rubs,  or gallops. Abdomen:  Soft, nontender and nondistended. Normal bowel sounds.   Neuro/Psych:  Normal mood and affect. A and O x 3  Maren Beach, MD [facility]

## 2023-10-11 ENCOUNTER — Ambulatory Visit: Admitting: Pediatrics

## 2023-10-11 ENCOUNTER — Encounter: Payer: Self-pay | Admitting: Pediatrics

## 2023-10-11 VITALS — BP 108/52 | HR 79 | Temp 98.6°F | Resp 16 | Ht 63.0 in | Wt 110.0 lb

## 2023-10-11 DIAGNOSIS — D128 Benign neoplasm of rectum: Secondary | ICD-10-CM | POA: Diagnosis not present

## 2023-10-11 DIAGNOSIS — Z1211 Encounter for screening for malignant neoplasm of colon: Secondary | ICD-10-CM | POA: Diagnosis not present

## 2023-10-11 DIAGNOSIS — D122 Benign neoplasm of ascending colon: Secondary | ICD-10-CM

## 2023-10-11 DIAGNOSIS — K648 Other hemorrhoids: Secondary | ICD-10-CM | POA: Diagnosis not present

## 2023-10-11 DIAGNOSIS — Z860100 Personal history of colon polyps, unspecified: Secondary | ICD-10-CM

## 2023-10-11 DIAGNOSIS — K621 Rectal polyp: Secondary | ICD-10-CM | POA: Diagnosis not present

## 2023-10-11 MED ORDER — SODIUM CHLORIDE 0.9 % IV SOLN: 500.0000 mL | Freq: Once | INTRAVENOUS | Status: DC

## 2023-10-11 NOTE — Progress Notes (Signed)
 Called to room to assist during endoscopic procedure.  Patient ID and intended procedure confirmed with present staff. Received instructions for my participation in the procedure from the performing physician.

## 2023-10-11 NOTE — Patient Instructions (Signed)
 Handouts provided about hemorrhoids and polyps.  Await pathology results.  Repeat colonoscopy in 5 years for surveillance.  Return to referring physician.   YOU HAD AN ENDOSCOPIC PROCEDURE TODAY AT THE [facility]:   Refer to the procedure report that was given to you for any specific questions about what was found during the examination.  If the procedure report does not answer your questions, please call your gastroenterologist to clarify.  If you requested that your care partner not be given the details of your procedure findings, then the procedure report has been included in a sealed envelope for you to review at your convenience later.  YOU SHOULD EXPECT: Some feelings of bloating in the abdomen. Passage of more gas than usual.  Walking can help get rid of the air that was put into your GI tract during the procedure and reduce the bloating. If you had a lower endoscopy (such as a colonoscopy or flexible sigmoidoscopy) you may notice spotting of blood in your stool or on the toilet paper. If you underwent a bowel prep for your procedure, you may not have a normal bowel movement for a few days.  Please Note:  You might notice some irritation and congestion in your nose or some drainage.  This is from the oxygen used during your procedure.  There is no need for concern and it should clear up in a day or so.  SYMPTOMS TO REPORT IMMEDIATELY:  Following lower endoscopy (colonoscopy or flexible sigmoidoscopy):  Excessive amounts of blood in the stool  Significant tenderness or worsening of abdominal pains  Swelling of the abdomen that is new, acute  Fever of 100F or higher   For urgent or emergent issues, a gastroenterologist can be reached at any hour by calling [phone]. Do not use MyChart messaging for urgent concerns.    DIET:  We do recommend a small meal at first, but then you may proceed to your regular diet.  Drink plenty of fluids but you should avoid alcoholic  beverages for 24 hours.  ACTIVITY:  You should plan to take it easy for the rest of today and you should NOT DRIVE or use heavy machinery until tomorrow (because of the sedation medicines used during the test).    FOLLOW UP: Our staff will call the number listed on your records the next business day following your procedure.  We will call around 7:15- 8:00 am to check on you and address any questions or concerns that you may have regarding the information given to you following your procedure. If we do not reach you, we will leave a message.     If any biopsies were taken you will be contacted by phone or by letter within the next 1-3 weeks.  Please call us at [phone] if you have not heard about the biopsies in 3 weeks.    SIGNATURES/CONFIDENTIALITY: You and/or your care partner have signed paperwork which will be entered into your electronic medical record.  These signatures attest to the fact that that the information above on your After Visit Summary has been reviewed and is understood.  Full responsibility of the confidentiality of this discharge information lies with you and/or your care-partner.

## 2023-10-11 NOTE — Progress Notes (Signed)
 Pt A/O x 3, gd SR's, pleased with anesthesia, report to RN

## 2023-10-11 NOTE — Op Note (Addendum)
 [facility] Patient Name: Jenna Obrien Procedure Date: 10/11/2023 2:25 PM MRN: [id number] Endoscopist: Maren Beach , MD, [id number] Age: 68 Referring MD:  Date of Birth: October 03, 1955 Gender: Female Account #: [id number] Procedure:                Colonoscopy Indications:              High risk colon cancer surveillance: Personal                            history of colonic polyps, Last colonoscopy: 2020 Medicines:                Monitored Anesthesia Care Procedure:                Pre-Anesthesia Assessment:                           - Prior to the procedure, a History and Physical                            was performed, and patient medications and                            allergies were reviewed. The patient's tolerance of                            previous anesthesia was also reviewed. The risks                            and benefits of the procedure and the sedation                            options and risks were discussed with the patient.                            All questions were answered, and informed consent                            was obtained. Prior Anticoagulants: The patient has                            taken no anticoagulant or antiplatelet agents. ASA                            Grade Assessment: II - A patient with mild systemic                            disease. After reviewing the risks and benefits,                            the patient was deemed in satisfactory condition to                            undergo the procedure.  After obtaining informed consent, the colonoscope                            was passed under direct vision. Throughout the                            procedure, the patient's blood pressure, pulse, and                            oxygen saturations were monitored continuously. The                            Colonoscope was introduced through the anus and                            advanced to  the cecum, identified by appendiceal                            orifice and ileocecal valve. The colonoscopy was                            technically difficult and complex due to a                            redundant colon, significant looping and a tortuous                            colon. Successful completion of the procedure was                            aided by changing the patient to a supine position,                            using manual pressure and straightening and                            shortening the scope to obtain bowel loop                            reduction. The patient tolerated the procedure                            well. The quality of the bowel preparation was                            adequate to identify polyps greater than 5 mm in                            size. The ileocecal valve, appendiceal orifice, and                            rectum were photographed. Scope In: 2:31:43 PM Scope Out: 3:04:15 PM Scope Withdrawal Time: 0 hours 13 minutes 46 seconds  Total Procedure Duration: 0 hours 32 minutes 32 seconds  Findings:                 Hemorrhoids were found on perianal exam.                           The digital rectal exam was normal. Pertinent                            negatives include normal sphincter tone and no                            palpable rectal lesions.                           A moderate amount of semi-liquid stool was found in                            the entire colon. Lavage of the area was performed                            using copious amounts of sterile water, resulting                            in clearance with adequate visualization.                           A 6 mm polyp was found in the ascending colon. The                            polyp was flat. The polyp was removed with a cold                            snare. Resection and retrieval were complete.                           A 5 mm polyp was found in the  rectum. The polyp was                            sessile. The polyp was removed with a cold snare.                            Resection and retrieval were complete.                           Internal hemorrhoids were found during retroflexion. Complications:            No immediate complications. Estimated blood loss:                            Minimal. Estimated Blood Loss:     Estimated blood loss was minimal. Impression:               - Hemorrhoids found on perianal exam.                           -  Stool in the entire examined colon.                           - One 6 mm polyp in the ascending colon, removed                            with a cold snare. Resected and retrieved.                           - One 5 mm polyp in the rectum, removed with a cold                            snare. Resected and retrieved.                           - Internal hemorrhoids. Recommendation:           - Discharge patient to home (ambulatory).                           - Await pathology results.                           - Repeat colonoscopy in 5 years for surveillance.                           - The findings and recommendations were discussed                            with the patient's family.                           - Return to referring physician.                           - Patient has a contact number available for                            emergencies. The signs and symptoms of potential                            delayed complications were discussed with the                            patient. Return to normal activities tomorrow.                            Written discharge instructions were provided to the                            patient. Maren Beach, MD 10/11/2023 3:09:28 PM This report has been signed electronically. Addendum Number: 1   Addendum Date: 10/11/2023 3:10:49 PM      Patient reports a pertinent family history of colorectal cancer and       polyps in multiple  relatives?"paternal aunt, paternal uncle, paternal  grandfather, paternal cousin Maren Beach, MD 10/11/2023 3:11:47 PM This report has been signed electronically.

## 2023-10-11 NOTE — Progress Notes (Signed)
 Pt's states no medical or surgical changes since previsit or office visit.

## 2023-10-16 ENCOUNTER — Telehealth: Payer: Self-pay | Admitting: *Deleted

## 2023-10-16 ENCOUNTER — Encounter: Payer: Self-pay | Admitting: Pediatrics

## 2023-10-16 LAB — SURGICAL PATHOLOGY

## 2023-10-16 NOTE — Telephone Encounter (Signed)
  Follow up Call-     10/11/2023    1:50 PM  Call back number  Post procedure Call Back phone  # [phone]  Permission to leave phone message Yes     Patient questions:  Do you have a fever, pain , or abdominal swelling? No. Pain Score  0 *  Have you tolerated food without any problems? Yes.    Have you been able to return to your normal activities? Yes.    Do you have any questions about your discharge instructions: Diet   No. Medications  No. Follow up visit  No.  Do you have questions or concerns about your Care? No.  Actions: * If pain score is 4 or above: No action needed, pain <4.

## 2023-11-04 ENCOUNTER — Other Ambulatory Visit: Payer: Self-pay | Admitting: Psychiatry

## 2023-11-04 DIAGNOSIS — F411 Generalized anxiety disorder: Secondary | ICD-10-CM

## 2023-11-04 DIAGNOSIS — F3342 Major depressive disorder, recurrent, in full remission: Secondary | ICD-10-CM

## 2023-11-22 ENCOUNTER — Telehealth: Payer: Self-pay | Admitting: Psychiatry

## 2023-11-22 DIAGNOSIS — F3342 Major depressive disorder, recurrent, in full remission: Secondary | ICD-10-CM

## 2023-11-22 DIAGNOSIS — F411 Generalized anxiety disorder: Secondary | ICD-10-CM

## 2023-11-23 NOTE — Telephone Encounter (Signed)
 RF sent.

## 2023-11-23 NOTE — Telephone Encounter (Signed)
 Patient called in for refill on Venlafaxine  XR 150mg . PH: [phone] Appt 6/16 Pharmacy [facility] [address] [place], [place]

## 2023-12-17 ENCOUNTER — Ambulatory Visit: Payer: PRIVATE HEALTH INSURANCE | Admitting: Psychiatry

## 2023-12-17 ENCOUNTER — Encounter: Payer: Self-pay | Admitting: Psychiatry

## 2023-12-17 DIAGNOSIS — F411 Generalized anxiety disorder: Secondary | ICD-10-CM

## 2023-12-17 DIAGNOSIS — F3342 Major depressive disorder, recurrent, in full remission: Secondary | ICD-10-CM | POA: Diagnosis not present

## 2023-12-17 DIAGNOSIS — Z8659 Personal history of other mental and behavioral disorders: Secondary | ICD-10-CM | POA: Diagnosis not present

## 2023-12-17 MED ORDER — VENLAFAXINE HCL ER 150 MG PO CP24: 300.0000 mg | ORAL_CAPSULE | Freq: Every day | ORAL | 3 refills | Status: AC

## 2023-12-17 NOTE — Progress Notes (Signed)
 Jenna Obrien [id number] May 04, 1956 68 y.o.  Subjective:   Patient ID:  Jenna Obrien is a 68 y.o. (DOB 28-Jan-1956) female.  Chief Complaint:  Chief Complaint  Patient presents with   Follow-up   Anxiety    Jenna Obrien presents today for follow-up of anxiety and depression.    seen December 2019.  She had been doing fairly well and no meds were changed.  06/2019 appt noted without med changes: Been healthy overall.  Family healthy.  D in [place] school.  Good she's been able to go to class.  Pleased about that.   Light orange venlafaxine  XR did not help but the brownish red color does well.  Is a particular generic Aurobindo. 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    09/28/2020 appointment with the following noted: Still able to get desired generic of venlafaxine  XR and doing OK.  Still works from home permanently.  Has it's advantages.  No complaints.   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 68 yo D driving now.  Stress with her lack of motivation for school.   No SE.  Plan continue Effexor  XR 150 2 daily as only psych med  11/02/21 appt noted. Doing well with Effexor  XR 300 mg daily.  No SE. Covid early Feb.   Stopped alcohol completely and feels better.  Asks question about naltrexone and accamprosate. Asks about other antidepressants that affect serotonin and NE.  10/16/22 appt noted: Good health. Still not drinking.  No sig craving. On Effexor  XR 300 mg daily. Asks about venlafaxine  LT.   No major memory issues but STM is not as good as it used to be.  Trouble with names.  No other concerns. May try to retire end of the year.  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 Doing largely OK.  Occ moments of emotional distress.  Most of the time mood and anxiety are under control.. Patient reports stable mood and denies depressed or irritable moods.  Patient denies any recent difficulty with anxiety.  Patient denies difficulty with sleep initiation or maintenance. Denies appetite  disturbance.  Patient reports that energy and motivation have been good.  Patient denies any difficulty with concentration.  Patient denies any suicidal ideation.  12/17/23 appt noted:  Med:  Effexor  XR 300 mg daily. Asks about Spravato.   D getting it from Dr. Deborra Falter.  Much mor positive.  A lot of physical complaints.  She is much better with Spravato.  She will do things she used to avoid.  Pt doing OK.  Oldest D living with her.  Oldest D still doesn't drive but needs to learn.   Youngest D living with her F.  She's 68 yo.  Her behavior upsets the pt bc it is self-destructive.  This D doesn't like pt asking questions.  Hoped to retire end of the year.  Lost job first of May;  bit of a downer.  Has started social security.  Now looking for PT job.   Not many hobbies.  Satisfied with med.  Some days bored and droopy but willing to work on that.    Past Psychiatric Medication Trials: Brief sertraline, nefazodone, venlafaxine  300,  buspirone,  Abilify 5,  Depakote 1000, Zolpidem Patient of this press practice since 1999  D getting Spravato with Pristiq with benefit.  Review of Systems:  Review of Systems  Gastrointestinal:        Heartburn   Neurological:  Negative for tremors.  Psychiatric/Behavioral:  Negative for agitation, behavioral problems,  confusion, decreased concentration, dysphoric mood, hallucinations, self-injury, sleep disturbance and suicidal ideas. The patient is not nervous/anxious and is not hyperactive.     Medications: I have reviewed the patient's current medications.  Current Outpatient Medications  Medication Sig Dispense Refill   Acetylcysteine (NAC 600) 600 MG CAPS Take 2 capsules by mouth daily at 6 (six) AM.     b complex vitamins capsule Take 1 capsule by mouth daily.     calcipotriene (DOVONOX) 0.005 % ointment Apply topically 2 (two) times daily.     Cholecalciferol (VITAMIN D3) 125 MCG (5000 UT) TABS Take 5,000 Units by mouth daily.     clobetasol  ointment (TEMOVATE) 0.05 % Apply topically.     NASACORT ALLERGY 24HR 55 MCG/ACT AERO nasal inhaler Place 1 spray into both nostrils daily as needed.  6   pantoprazole (PROTONIX) 20 MG tablet TAKE 2 TABLETS BY MOUTH DAILY AND 1 TABLET BY MOUTH WITH DINNER     PROLIA  60 MG/ML SOLN injection Inject 60 mg into the skin every 6 (six) months.     Turmeric (QC TUMERIC COMPLEX) 500 MG CAPS Take 1 capsule by mouth daily at 6 (six) AM.     ondansetron  (ZOFRAN ) 4 MG tablet Take 1 tablet (4 mg total) by mouth every 8 (eight) hours as needed for nausea or vomiting. (Patient not taking: Reported on 12/17/2023) 20 tablet 0   oxyCODONE -acetaminophen  (PERCOCET) 10-325 MG tablet Take 1 tablet by mouth every 4 (four) hours as needed for pain. (Patient not taking: Reported on 12/17/2023) 20 tablet 0   venlafaxine  XR (EFFEXOR -XR) 150 MG 24 hr capsule Take 2 capsules (300 mg total) by mouth daily. 180 capsule 3   No current facility-administered medications for this visit.    Medication Side Effects: None  Allergies: No Known Allergies  Past Medical History:  Diagnosis Date   Abnormal Pap smear of cervix    in 20's   Anxiety    Depression    DES exposure in utero    Dyspareunia    Family history of colon cancer    Family history of prostate cancer    Family history of uterine cancer    Fibroid    Heart murmur    Osteopenia    Osteoporosis    PVC's (premature ventricular contractions)     Family History  Problem Relation Age of Onset   Diabetes Mother    Hypertension Mother    Thyroid disease Mother    AAA (abdominal aortic aneurysm) Mother    Colon polyps Father    Lung cancer Father 70   Prostate cancer Father        dx in his 65s   Head & neck cancer Maternal Uncle        oral cancer   Colon cancer Paternal Aunt        dx in her 20s   Colon cancer Paternal Uncle        colon cancer vs colon polyps   Lung cancer Paternal Uncle        dx in his 84s-50s   Heart attack Maternal Grandmother     Heart attack Maternal Grandfather    Colon cancer Paternal Grandfather        dx over 21s   Rectal cancer Cousin    Colon polyps Cousin    Colon cancer Cousin        paternal cousin dx in her 45s-40   Uterine cancer Cousin  paternal cousin   Rectal cancer Other    Colon polyps Other    Breast cancer Neg Hx    Stomach cancer Neg Hx     Social History   Socioeconomic History   Marital status: Divorced    Spouse name: Not on file   Number of children: Not on file   Years of education: Not on file   Highest education level: Not on file  Occupational History   Not on file  Tobacco Use   Smoking status: Never   Smokeless tobacco: Never  Vaping Use   Vaping status: Never Used  Substance and Sexual Activity   Alcohol use: Not on file   Drug use: No   Sexual activity: Not Currently    Partners: Male    Birth control/protection: Surgical    Comment: DES exposure-1st intercourse 68 yo-Fewer than 5 partners  Other Topics Concern   Not on file  Social History Narrative   Not on file   Social Drivers of Health   Financial Resource Strain: Not on file  Food Insecurity: Not on file  Transportation Needs: Not on file  Physical Activity: Not on file  Stress: Not on file  Social Connections: Not on file  Intimate Partner Violence: Not on file    Past Medical History, Surgical history, Social history, and Family history were reviewed and updated as appropriate.   Please see review of systems for further details on the patient's review from today.   Objective:   Physical Exam:  LMP 08/03/1998   Physical Exam Constitutional:      General: She is not in acute distress.    Appearance: She is well-developed.   Musculoskeletal:        General: No deformity.   Neurological:     Mental Status: She is alert and oriented to person, place, and time.     Cranial Nerves: No dysarthria.     Coordination: Coordination normal.   Psychiatric:        Attention and  Perception: Attention and perception normal. She is attentive.        Mood and Affect: Mood normal. Mood is not anxious or depressed. Affect is not labile, blunt, angry or inappropriate.        Speech: Speech normal.        Behavior: Behavior normal. Behavior is cooperative.        Thought Content: Thought content normal. Thought content is not paranoid or delusional. Thought content does not include homicidal or suicidal ideation. Thought content does not include suicidal plan.        Cognition and Memory: Cognition and memory normal.        Judgment: Judgment normal.     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  Reports anxiety and depressive symptoms are under control. Completely free of alcoholol.  Answered questions about what she read on meds for sobriety promotion.  She does not feel they are necessary now. Pleased with current generic of venlafaxine .  Had poor response from the venlafaxine  XR that is an orange color but does well with this particular generic and with the brand.  Supportive therapy dealing with starting PT work for mental health.    Eating disorder symptoms are under control by her report.  Supportive therapy at maintaining sobriety.  She has done well with  this.  She is satisfied with the Effexor  at controlling symptoms of depression and anxiety.  We discussed withdrawal symptoms related to Effexor  because of its short half-life.  Answered questions about the mechanism of action of venlafaxine  versus desvenlafaxine which her daughter takes.  No sig evidence for cog px from venlafaxine  .  Rec trial of the following for mild complaints forgetfulness  (NAC) N-Acetylcysteine 2 of the  600 mg capsules daily to help with mild cognitive problems.  It can be combined with a B-complex vitamin as the B-12 and folate which can sometimes enhance the effect.  No med changes today.  Effexor  XR 300 mg daily  Because of stability will wait 1 year before next appointment.  High risk with stopping.   Nori Beat, MD, DFAPA    Please see After Visit Summary for patient specific instructions.  Future Appointments  Date Time Provider Department Center  12/24/2023 12:30 PM [facility] 3 [facility]  01/17/2024  2:30 PM Chrzanowski, Jami B, NP [facility] None    No orders of the defined types were placed in this encounter.     -------------------------------

## 2023-12-24 ENCOUNTER — Other Ambulatory Visit: Payer: Self-pay | Admitting: Internal Medicine

## 2023-12-24 ENCOUNTER — Ambulatory Visit
Admission: RE | Admit: 2023-12-24 | Discharge: 2023-12-24 | Disposition: A | Discharge status: Home / Self Care | Source: Ambulatory Visit | Attending: Internal Medicine | Admitting: Internal Medicine

## 2023-12-24 DIAGNOSIS — Z1231 Encounter for screening mammogram for malignant neoplasm of breast: Secondary | ICD-10-CM

## 2023-12-31 ENCOUNTER — Telehealth (HOSPITAL_COMMUNITY): Payer: Self-pay | Admitting: Pharmacy Technician

## 2023-12-31 NOTE — Telephone Encounter (Addendum)
 Auth Submission: NO AUTH NEEDED Site of care: Site of care: [facility] Payer: MEDICARE A/B, AARP SUPP Medication & CPT/J Code(s) submitted: Prolia  (Denosumab ) [id number] Diagnosis Code: M81.0 Route of submission (phone, fax, portal):  Phone # Fax # Auth type: Buy/Bill HB Units/visits requested: 60MG  x 2 doses  Reference number:  Approval from: 12/31/23 to 08/02/24   Medicare will cover 80%, AARP supp will cover 20% remaining. Prolia  will be covered at 100%.   Jenna Obrien, CPhT [facility] [phone]

## 2024-01-09 ENCOUNTER — Other Ambulatory Visit (HOSPITAL_COMMUNITY): Payer: Self-pay | Admitting: *Deleted

## 2024-01-11 ENCOUNTER — Ambulatory Visit (HOSPITAL_COMMUNITY)
Admission: RE | Admit: 2024-01-11 | Discharge: 2024-01-11 | Disposition: A | Discharge status: Home / Self Care | Source: Ambulatory Visit | Attending: Internal Medicine | Admitting: Internal Medicine

## 2024-01-11 DIAGNOSIS — M810 Age-related osteoporosis without current pathological fracture: Secondary | ICD-10-CM | POA: Insufficient documentation

## 2024-01-11 MED ORDER — DENOSUMAB 60 MG/ML ~~LOC~~ SOSY
PREFILLED_SYRINGE | SUBCUTANEOUS | Status: AC
  Filled 2024-01-11: qty 1

## 2024-01-11 MED ORDER — DENOSUMAB 60 MG/ML ~~LOC~~ SOSY
60.0000 mg | PREFILLED_SYRINGE | Freq: Once | SUBCUTANEOUS | Status: AC
  Administered 2024-01-11: 60 mg via SUBCUTANEOUS

## 2024-01-17 ENCOUNTER — Encounter: Payer: Self-pay | Admitting: Radiology

## 2024-01-17 ENCOUNTER — Ambulatory Visit (INDEPENDENT_AMBULATORY_CARE_PROVIDER_SITE_OTHER): Admitting: Radiology

## 2024-01-17 VITALS — BP 110/62 | HR 111 | Ht 62.99 in | Wt 112.4 lb

## 2024-01-17 DIAGNOSIS — Z9189 Other specified personal risk factors, not elsewhere classified: Secondary | ICD-10-CM

## 2024-01-17 DIAGNOSIS — Z7729 Contact with and (suspected ) exposure to other hazardous substances: Secondary | ICD-10-CM

## 2024-01-17 DIAGNOSIS — Z01419 Encounter for gynecological examination (general) (routine) without abnormal findings: Secondary | ICD-10-CM

## 2024-01-17 DIAGNOSIS — N952 Postmenopausal atrophic vaginitis: Secondary | ICD-10-CM

## 2024-01-17 NOTE — Progress Notes (Signed)
   Jenna Obrien [birth year] [id number]   History:  68 y.o. G0 presents for annual exam. DES daughter. No history of abnormal pap smears. Last pap 2023 negative.  Gynecologic History Hysterectomy. TAH fibroids 08/1998  OB History     Gravida  0   Para      Term      Preterm      AB      Living         SAB      IAB      Ectopic      Multiple      Live Births           Obstetric Comments  2 adopted children          Health Maintenance Last Pap: 12/23/21 normal, HPV negative Last mammogram: 12/24/23. Results were: normal Last colonoscopy: 4/25.  Last Dexa: 2022 on Prolia , managed by PCP. Next DEXA scheduled 2/26   Risk Factors for Medicare Patients >/= 5 sexual partners in a lifetime: No First intercourse <58 years of age: No H/O STD at any age: No Abnormal pap smear, < 3 negative paps within the last 7 years: No DES exposure (women born between [phone]): Yes Patient is on post breast cancer medication like Femara or, if medication like this is not needed, 5 years post breast cancer diagnosis: No    Past medical history, past surgical history, family history and social history were all reviewed and documented in the EPIC chart.  ROS:  A ROS was performed and pertinent positives and negatives are included.  Exam:  Vitals:   01/17/24 1429  BP: 110/62  Pulse: (!) 111  SpO2: 95%  Weight: 112 lb 6.4 oz (51 kg)  Height: 5' 2.99 (1.6 m)   Body mass index is 19.92 kg/m.  General appearance:  Normal Abdominal  Soft,nontender, without masses, guarding or rebound.  Liver/spleen:  No organomegaly noted  Hernia:  None appreciated  Skin  Inspection:  Grossly normal Breasts: Examined lying and sitting. Bilateral implants  Right: Without masses, retractions, nipple discharge or axillary adenopathy.   Left: Without masses, retractions, nipple discharge or axillary adenopathy. Genitourinary   Inguinal/mons:  Normal without inguinal  adenopathy  External genitalia:  Normal appearing vulva with no masses, tenderness, or lesions  BUS/Urethra/Skene's glands:  Normal  Vagina:  Normal appearing with normal color and discharge, no lesions. Atrophy moderate  Cervix:  absent  Uterus:  absent  Adnexa/parametria:     Rt: Normal in size, without masses or tenderness.   Lt: Normal in size, without masses or tenderness.  Anus and perineum: Normal   Darice Hoit, CMA present for exam  Assessment/Plan:   1. Encounter for breast and pelvic examination (Primary)  Vaginal pap with next exam  Return in 1year for high risk B&P  Miyuki Rzasa B WHNP-BC 2:38 PM 01/17/2024

## 2024-04-14 ENCOUNTER — Encounter: Admitting: Obstetrics and Gynecology

## 2024-06-09 ENCOUNTER — Telehealth: Payer: Self-pay

## 2024-06-09 NOTE — Telephone Encounter (Signed)
 Patient called states that she saw Jami in July this year and was told it is not recommended for her to have a pap every year even through she has had DES exposure. Patient is requesting for Dr. Nikki to advise on this. Patient states that it is ok to send fpl group.

## 2024-06-10 NOTE — Telephone Encounter (Signed)
 Pap and pelvic exams are recommended yearly with no age for stopping for patients with history of DES exposure.   High risk HPV testing is not performed yearly however, due to DES exposure.   HPV testing would follow routine cervical cancer screening guidelines including prior pap history, prior HPV status, any prior treatment for abnormalities.

## 2024-06-10 NOTE — Telephone Encounter (Signed)
 Please have patient make an appointment with me for a pap. Ok to do in January.

## 2024-06-10 NOTE — Telephone Encounter (Signed)
 Left message at number DRP for patient call and schedule pap only with Dr. Nikki in Jan.

## 2024-06-11 NOTE — Telephone Encounter (Signed)
 Per review of EPIC, pap scheduled for 07/30/24.

## 2024-06-13 NOTE — Telephone Encounter (Signed)
 [email or web address]

## 2024-07-30 ENCOUNTER — Ambulatory Visit: Admitting: Obstetrics and Gynecology

## 2024-07-30 ENCOUNTER — Encounter: Payer: Self-pay | Admitting: Obstetrics and Gynecology

## 2024-07-30 ENCOUNTER — Other Ambulatory Visit (HOSPITAL_COMMUNITY)
Admission: RE | Admit: 2024-07-30 | Discharge: 2024-07-30 | Disposition: A | Discharge status: Home / Self Care | Source: Ambulatory Visit | Attending: Obstetrics and Gynecology | Admitting: Obstetrics and Gynecology

## 2024-07-30 VITALS — BP 110/62 | HR 121

## 2024-07-30 DIAGNOSIS — Z1272 Encounter for screening for malignant neoplasm of vagina: Secondary | ICD-10-CM | POA: Diagnosis present

## 2024-07-30 DIAGNOSIS — Z91B Personal risk factor of exposure to diethylstilbestrol: Secondary | ICD-10-CM | POA: Insufficient documentation

## 2024-07-30 DIAGNOSIS — N952 Postmenopausal atrophic vaginitis: Secondary | ICD-10-CM

## 2024-07-30 NOTE — Progress Notes (Signed)
 "  GYNECOLOGY  VISIT   HPI: 69 y.o.   Divorced  Caucasian female   G0P0 with Patient's last menstrual period was 08/03/1998.   here for: Pap smear.  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     No tx for abnormal pap in past.   Hysterectomy for fibroids.   Cervix was removed.    Paps following hysterectomy have been normal.     Doing Prolia  twice yearly at [facility].    Dexa with PCP in office.     GYNECOLOGIC HISTORY: Patient's last menstrual period was 08/03/1998. Contraception:  Hyst  Menopausal hormone therapy:  n/a Last 2 paps:  12/23/21 neg, HR HPV neg, 10/20/20 neg, HR HPV neg  History of abnormal Pap or positive HPV:  yes, in her 20's Mammogram:  12/24/23 Breast Density Cat C, BIRADS cat 1 neg         OB History     Gravida  0   Para      Term      Preterm      AB      Living         SAB      IAB      Ectopic      Multiple      Live Births           Obstetric Comments  2 adopted children            Patient Active Problem List   Diagnosis Date Noted   History of adenomatous polyp of colon 08/19/2018   GAD (generalized anxiety disorder) 04/28/2018   Presenile dementia with depressive features (HCC) 04/28/2018   Genetic testing 03/06/2017   Family history of colon cancer    Family history of uterine cancer    Family history of prostate cancer    Osteopenia 01/13/2013    Past Medical History:  Diagnosis Date   Abnormal Pap smear of cervix    in 20's   Anxiety    Depression    DES exposure in utero    Dyspareunia    Family history of colon cancer    Family history of prostate cancer    Family history of uterine cancer    Fibroid    Heart murmur    Osteopenia    Osteoporosis    PVC's (premature ventricular contractions)     Past Surgical History:  Procedure Laterality Date   ABDOMINAL HYSTERECTOMY  08/1998   TAH--Due to fibroids--Dr. Cary   AUGMENTATION MAMMAPLASTY     BREAST EXCISIONAL BIOPSY     KNEE SURGERY Left    Fall 2023   TOE SURGERY Right     Big toe Jan 2025    Current Outpatient Medications  Medication Sig Dispense Refill   Acetylcysteine (NAC 600) 600 MG CAPS Take 2 capsules by mouth daily at 6 (six) AM.     b complex vitamins capsule Take 1 capsule by mouth daily.     calcipotriene (DOVONOX) 0.005 % ointment Apply topically 2 (two) times daily.     Cholecalciferol (VITAMIN D3) 125 MCG (5000 UT) TABS Take 5,000 Units by mouth daily.     clobetasol ointment (TEMOVATE) 0.05 % Apply topically.     meloxicam (MOBIC) 15 MG tablet Take 15 mg by mouth daily as needed.     NASACORT ALLERGY 24HR 55 MCG/ACT AERO nasal inhaler Place 1 spray into both nostrils daily as needed.  6   pantoprazole (PROTONIX) 20 MG tablet TAKE 2 TABLETS BY MOUTH  DAILY AND 1 TABLET BY MOUTH WITH DINNER     PROLIA  60 MG/ML SOLN injection Inject 60 mg into the skin every 6 (six) months.     Turmeric (QC TUMERIC COMPLEX) 500 MG CAPS Take 1 capsule by mouth daily at 6 (six) AM.     venlafaxine  XR (EFFEXOR -XR) 150 MG 24 hr capsule Take 2 capsules (300 mg total) by mouth daily. 180 capsule 3   No current facility-administered medications for this visit.     ALLERGIES: Patient has no known allergies.  Family History  Problem Relation Age of Onset   Diabetes Mother    Hypertension Mother    Thyroid disease Mother    AAA (abdominal aortic aneurysm) Mother    Colon polyps Father    Lung cancer Father 74   Prostate cancer Father        dx in his 51s   Head & neck cancer Maternal Uncle        oral cancer   Colon cancer Paternal Aunt        dx in her 56s   Colon cancer Paternal Uncle        colon cancer vs colon polyps   Lung cancer Paternal Uncle        dx in his 12s-50s   Heart attack Maternal Grandmother    Heart attack Maternal Grandfather    Colon cancer Paternal Grandfather        dx over 54s   Rectal cancer Cousin    Colon polyps Cousin    Colon cancer Cousin        paternal cousin dx in her 79s-40   Uterine cancer Cousin        paternal  cousin   Rectal cancer Other    Colon polyps Other    Breast cancer Neg Hx    Stomach cancer Neg Hx     Social History   Socioeconomic History   Marital status: Divorced    Spouse name: Not on file   Number of children: Not on file   Years of education: Not on file   Highest education level: Not on file  Occupational History   Not on file  Tobacco Use   Smoking status: Never    Passive exposure: Past   Smokeless tobacco: Never  Vaping Use   Vaping status: Never Used  Substance and Sexual Activity   Alcohol use: Not Currently   Drug use: No   Sexual activity: Not Currently    Partners: Male    Birth control/protection: Surgical    Comment: hyst, DES exposure-1st intercourse 69 yo-Fewer than 5 partners  Other Topics Concern   Not on file  Social History Narrative   Not on file   Social Drivers of Health   Tobacco Use: Low Risk (07/30/2024)   Patient History    Smoking Tobacco Use: Never    Smokeless Tobacco Use: Never    Passive Exposure: Past  Financial Resource Strain: Not on file  Food Insecurity: Not on file  Transportation Needs: Not on file  Physical Activity: Not on file  Stress: Not on file  Social Connections: Not on file  Intimate Partner Violence: Not on file  Depression (EYV7-0): Not on file  Alcohol Screen: Not on file  Housing: Not on file  Utilities: Not on file  Health Literacy: Not on file    Review of Systems  All other systems reviewed and are negative.   PHYSICAL EXAMINATION:   BP 110/62 (BP Location:  Left Arm, Patient Position: Sitting)   Pulse (!) 121   LMP 08/03/1998   SpO2 97%     General appearance: alert, cooperative and appears stated age   Pelvic: External genitalia:  no lesions              Urethra:  normal appearing urethra with no masses, tenderness or lesions              Bartholins and Skenes: normal                 Vagina: normal appearing vagina with normal color and discharge, no lesions              Cervix:   absent                Bimanual Exam:  Uterus: absent.  Atrophy noted and bleeding occurred with pap collected.               Adnexa: no mass, fullness, tenderness        Chaperone was present for exam:  Kari HERO, MCA  ASSESSMENT:  Status post hysterectomy.  DES exposure.  Screening for vaginal cancer.  Vaginal atrophy.  PLAN:  Comprehensive discussion of DES exposure and screening for vaginal and cervical clear cell adenocarcinoma.  Data reviewed from ASCCP clinical consensus regarding option for discontinuation of paps in women over 65.   Risks and benefits of paps reviewed.  Patient elects for pap of vagina today.   Vaginal atrophy discussed and treatment options reviewed:  vaginal moisturizers, cooking oils, vaginal estrogen.  No prescription provided today.  Follow up for breast and pelvic exam.  25 min  total time was spent for this patient encounter, including preparation, face-to-face counseling with the patient, coordination of care, and documentation of the encounter.    "

## 2024-08-01 LAB — CYTOLOGY - PAP: Diagnosis: NEGATIVE

## 2024-08-02 ENCOUNTER — Telehealth: Payer: Self-pay | Admitting: Obstetrics and Gynecology

## 2024-08-02 ENCOUNTER — Ambulatory Visit

## 2024-08-02 DIAGNOSIS — R3915 Urgency of urination: Secondary | ICD-10-CM

## 2024-08-02 MED ORDER — SULFAMETHOXAZOLE-TRIMETHOPRIM 800-160 MG PO TABS: 1.0000 | ORAL_TABLET | Freq: Two times a day (BID) | ORAL | 0 refills | Status: AC

## 2024-08-02 NOTE — Telephone Encounter (Signed)
 Pt called answering service. She had her annual earlier this week and a pap smear. Noticed some cramping after then yesterday night noticed some blood in the urine. Still experiencing some pressure and cramping and having more urinary urgency. But does not have any burning when she urinates. She is s/p hysterectomy and does not feel the blood is coming from the vagina- has not noticed any in her underwear.   We discussed that she may have a UTI. She denies fever. Her pharmacy is closed today due to the snow. I will sent her prescription for bactrim  to treat Uti to her [facility] pharmacy and she can pick it up when able. If symptoms increase, or she notices fever, upper back pain or large clots, advised her to call back. She will call the office on Monday to arrange f/u appt.   Rosaline LOISE Caper, MD

## 2024-08-04 ENCOUNTER — Ambulatory Visit: Payer: Self-pay | Admitting: Obstetrics and Gynecology

## 2024-09-08 ENCOUNTER — Ambulatory Visit: Admitting: Obstetrics and Gynecology

## 2024-12-16 ENCOUNTER — Ambulatory Visit: Admitting: Psychiatry

## 2025-01-20 ENCOUNTER — Encounter: Admitting: Radiology

## 2025-01-21 ENCOUNTER — Encounter: Admitting: Obstetrics and Gynecology
